# Patient Record
Sex: Female | Born: 1950 | Race: White | Hispanic: No | Marital: Married | Smoking: Never smoker
Health system: Southern US, Community
[De-identification: ages and names within clinical notes are randomized; demographics above are authoritative.]

## PROBLEM LIST (undated history)

## (undated) DIAGNOSIS — M1990 Unspecified osteoarthritis, unspecified site: Secondary | ICD-10-CM

## (undated) DIAGNOSIS — C801 Malignant (primary) neoplasm, unspecified: Secondary | ICD-10-CM

## (undated) DIAGNOSIS — K219 Gastro-esophageal reflux disease without esophagitis: Secondary | ICD-10-CM

## (undated) DIAGNOSIS — Z923 Personal history of irradiation: Secondary | ICD-10-CM

## (undated) DIAGNOSIS — Z9221 Personal history of antineoplastic chemotherapy: Secondary | ICD-10-CM

## (undated) DIAGNOSIS — K5900 Constipation, unspecified: Secondary | ICD-10-CM

## (undated) DIAGNOSIS — I639 Cerebral infarction, unspecified: Secondary | ICD-10-CM

## (undated) DIAGNOSIS — M810 Age-related osteoporosis without current pathological fracture: Secondary | ICD-10-CM

## (undated) DIAGNOSIS — F32A Depression, unspecified: Secondary | ICD-10-CM

## (undated) DIAGNOSIS — F329 Major depressive disorder, single episode, unspecified: Secondary | ICD-10-CM

## (undated) DIAGNOSIS — I10 Essential (primary) hypertension: Secondary | ICD-10-CM

## (undated) DIAGNOSIS — Z87442 Personal history of urinary calculi: Secondary | ICD-10-CM

## (undated) DIAGNOSIS — Z9889 Other specified postprocedural states: Secondary | ICD-10-CM

## (undated) DIAGNOSIS — E7800 Pure hypercholesterolemia, unspecified: Secondary | ICD-10-CM

## (undated) HISTORY — PX: CHOLECYSTECTOMY: SHX55

## (undated) HISTORY — DX: Age-related osteoporosis without current pathological fracture: M81.0

## (undated) HISTORY — PX: TRACHEOSTOMY: SUR1362

## (undated) HISTORY — PX: LYMPH GLAND EXCISION: SHX13

---

## 2002-07-16 DIAGNOSIS — C801 Malignant (primary) neoplasm, unspecified: Secondary | ICD-10-CM

## 2002-07-16 DIAGNOSIS — Z9221 Personal history of antineoplastic chemotherapy: Secondary | ICD-10-CM

## 2002-07-16 DIAGNOSIS — Z923 Personal history of irradiation: Secondary | ICD-10-CM

## 2002-07-16 HISTORY — PX: OTHER SURGICAL HISTORY: SHX169

## 2002-07-16 HISTORY — DX: Malignant (primary) neoplasm, unspecified: C80.1

## 2002-07-16 HISTORY — DX: Personal history of irradiation: Z92.3

## 2002-07-16 HISTORY — DX: Personal history of antineoplastic chemotherapy: Z92.21

## 2002-11-05 ENCOUNTER — Encounter: Payer: Self-pay | Admitting: Obstetrics and Gynecology

## 2002-11-05 ENCOUNTER — Ambulatory Visit (HOSPITAL_COMMUNITY): Admission: RE | Admit: 2002-11-05 | Discharge: 2002-11-05 | Payer: Self-pay | Admitting: Obstetrics and Gynecology

## 2002-11-18 ENCOUNTER — Encounter: Payer: Self-pay | Admitting: Obstetrics and Gynecology

## 2002-11-18 ENCOUNTER — Ambulatory Visit (HOSPITAL_COMMUNITY): Admission: RE | Admit: 2002-11-18 | Discharge: 2002-11-18 | Payer: Self-pay | Admitting: Obstetrics and Gynecology

## 2002-11-27 ENCOUNTER — Encounter: Payer: Self-pay | Admitting: Obstetrics and Gynecology

## 2002-11-27 ENCOUNTER — Encounter (INDEPENDENT_AMBULATORY_CARE_PROVIDER_SITE_OTHER): Payer: Self-pay | Admitting: Specialist

## 2002-11-27 ENCOUNTER — Encounter: Admission: RE | Admit: 2002-11-27 | Discharge: 2002-11-27 | Payer: Self-pay | Admitting: Obstetrics and Gynecology

## 2002-11-27 ENCOUNTER — Other Ambulatory Visit: Admission: RE | Admit: 2002-11-27 | Discharge: 2002-11-27 | Payer: Self-pay | Admitting: Diagnostic Radiology

## 2002-12-02 ENCOUNTER — Encounter: Payer: Self-pay | Admitting: General Surgery

## 2002-12-02 ENCOUNTER — Encounter (HOSPITAL_COMMUNITY): Admission: RE | Admit: 2002-12-02 | Discharge: 2003-03-02 | Payer: Self-pay | Admitting: General Surgery

## 2002-12-03 ENCOUNTER — Encounter: Payer: Self-pay | Admitting: General Surgery

## 2002-12-04 ENCOUNTER — Encounter: Payer: Self-pay | Admitting: General Surgery

## 2002-12-04 ENCOUNTER — Encounter: Admission: RE | Admit: 2002-12-04 | Discharge: 2002-12-04 | Payer: Self-pay | Admitting: General Surgery

## 2002-12-07 ENCOUNTER — Ambulatory Visit (HOSPITAL_BASED_OUTPATIENT_CLINIC_OR_DEPARTMENT_OTHER): Admission: RE | Admit: 2002-12-07 | Discharge: 2002-12-07 | Payer: Self-pay | Admitting: General Surgery

## 2002-12-07 ENCOUNTER — Encounter: Payer: Self-pay | Admitting: General Surgery

## 2002-12-07 ENCOUNTER — Encounter (INDEPENDENT_AMBULATORY_CARE_PROVIDER_SITE_OTHER): Payer: Self-pay | Admitting: *Deleted

## 2002-12-22 ENCOUNTER — Encounter: Payer: Self-pay | Admitting: Oncology

## 2002-12-22 ENCOUNTER — Ambulatory Visit: Admission: RE | Admit: 2002-12-22 | Discharge: 2003-01-06 | Payer: Self-pay | Admitting: Radiation Oncology

## 2002-12-22 ENCOUNTER — Ambulatory Visit (HOSPITAL_COMMUNITY): Admission: RE | Admit: 2002-12-22 | Discharge: 2002-12-22 | Payer: Self-pay | Admitting: Oncology

## 2002-12-29 ENCOUNTER — Encounter (INDEPENDENT_AMBULATORY_CARE_PROVIDER_SITE_OTHER): Payer: Self-pay | Admitting: *Deleted

## 2002-12-29 ENCOUNTER — Encounter: Payer: Self-pay | Admitting: General Surgery

## 2002-12-29 ENCOUNTER — Ambulatory Visit (HOSPITAL_BASED_OUTPATIENT_CLINIC_OR_DEPARTMENT_OTHER): Admission: RE | Admit: 2002-12-29 | Discharge: 2002-12-30 | Payer: Self-pay | Admitting: General Surgery

## 2003-01-26 ENCOUNTER — Encounter (HOSPITAL_COMMUNITY): Admission: RE | Admit: 2003-01-26 | Discharge: 2003-02-25 | Payer: Self-pay | Admitting: Oncology

## 2003-01-26 ENCOUNTER — Encounter: Admission: RE | Admit: 2003-01-26 | Discharge: 2003-01-26 | Payer: Self-pay | Admitting: Oncology

## 2003-03-02 ENCOUNTER — Encounter: Admission: RE | Admit: 2003-03-02 | Discharge: 2003-03-02 | Payer: Self-pay | Admitting: Oncology

## 2003-03-02 ENCOUNTER — Encounter (HOSPITAL_COMMUNITY): Admission: RE | Admit: 2003-03-02 | Discharge: 2003-04-01 | Payer: Self-pay | Admitting: Oncology

## 2003-04-14 ENCOUNTER — Encounter (HOSPITAL_COMMUNITY): Admission: RE | Admit: 2003-04-14 | Discharge: 2003-05-14 | Payer: Self-pay | Admitting: Oncology

## 2003-04-14 ENCOUNTER — Encounter: Admission: RE | Admit: 2003-04-14 | Discharge: 2003-04-14 | Payer: Self-pay | Admitting: Oncology

## 2003-05-26 ENCOUNTER — Encounter (HOSPITAL_COMMUNITY): Admission: RE | Admit: 2003-05-26 | Discharge: 2003-06-25 | Payer: Self-pay | Admitting: Oncology

## 2003-05-26 ENCOUNTER — Encounter: Admission: RE | Admit: 2003-05-26 | Discharge: 2003-05-26 | Payer: Self-pay | Admitting: Oncology

## 2003-06-03 ENCOUNTER — Ambulatory Visit: Admission: RE | Admit: 2003-06-03 | Discharge: 2003-08-05 | Payer: Self-pay | Admitting: Radiation Oncology

## 2003-06-28 ENCOUNTER — Encounter: Admission: RE | Admit: 2003-06-28 | Discharge: 2003-06-28 | Payer: Self-pay | Admitting: Oncology

## 2003-06-28 ENCOUNTER — Encounter (HOSPITAL_COMMUNITY): Admission: RE | Admit: 2003-06-28 | Discharge: 2003-07-28 | Payer: Self-pay | Admitting: Oncology

## 2003-07-13 ENCOUNTER — Ambulatory Visit (HOSPITAL_BASED_OUTPATIENT_CLINIC_OR_DEPARTMENT_OTHER): Admission: RE | Admit: 2003-07-13 | Discharge: 2003-07-13 | Payer: Self-pay | Admitting: General Surgery

## 2003-09-27 ENCOUNTER — Encounter: Admission: RE | Admit: 2003-09-27 | Discharge: 2003-09-27 | Payer: Self-pay | Admitting: Oncology

## 2003-09-27 ENCOUNTER — Encounter (HOSPITAL_COMMUNITY): Admission: RE | Admit: 2003-09-27 | Discharge: 2003-10-27 | Payer: Self-pay | Admitting: Oncology

## 2003-11-15 ENCOUNTER — Encounter (HOSPITAL_COMMUNITY): Admission: RE | Admit: 2003-11-15 | Discharge: 2003-12-15 | Payer: Self-pay | Admitting: Oncology

## 2003-11-15 ENCOUNTER — Encounter: Admission: RE | Admit: 2003-11-15 | Discharge: 2003-11-15 | Payer: Self-pay | Admitting: Oncology

## 2003-12-22 ENCOUNTER — Ambulatory Visit (HOSPITAL_COMMUNITY): Admission: RE | Admit: 2003-12-22 | Discharge: 2003-12-22 | Payer: Self-pay | Admitting: General Surgery

## 2004-04-12 ENCOUNTER — Encounter (HOSPITAL_COMMUNITY): Admission: RE | Admit: 2004-04-12 | Discharge: 2004-05-12 | Payer: Self-pay | Admitting: Internal Medicine

## 2004-04-12 ENCOUNTER — Encounter: Admission: RE | Admit: 2004-04-12 | Discharge: 2004-04-12 | Payer: Self-pay | Admitting: Oncology

## 2004-09-28 ENCOUNTER — Ambulatory Visit (HOSPITAL_COMMUNITY): Admission: RE | Admit: 2004-09-28 | Discharge: 2004-09-28 | Payer: Self-pay | Admitting: *Deleted

## 2004-11-15 ENCOUNTER — Encounter (HOSPITAL_COMMUNITY): Admission: RE | Admit: 2004-11-15 | Discharge: 2004-12-15 | Payer: Self-pay | Admitting: Oncology

## 2004-11-15 ENCOUNTER — Encounter: Admission: RE | Admit: 2004-11-15 | Discharge: 2004-11-15 | Payer: Self-pay | Admitting: Oncology

## 2004-11-15 ENCOUNTER — Ambulatory Visit (HOSPITAL_COMMUNITY): Payer: Self-pay | Admitting: Oncology

## 2005-02-28 ENCOUNTER — Encounter: Admission: RE | Admit: 2005-02-28 | Discharge: 2005-02-28 | Payer: Self-pay | Admitting: Oncology

## 2005-02-28 ENCOUNTER — Encounter (HOSPITAL_COMMUNITY): Admission: RE | Admit: 2005-02-28 | Discharge: 2005-03-30 | Payer: Self-pay | Admitting: Oncology

## 2005-05-30 ENCOUNTER — Encounter (HOSPITAL_COMMUNITY): Admission: RE | Admit: 2005-05-30 | Discharge: 2005-06-29 | Payer: Self-pay | Admitting: Oncology

## 2005-05-30 ENCOUNTER — Encounter: Admission: RE | Admit: 2005-05-30 | Discharge: 2005-05-30 | Payer: Self-pay | Admitting: Oncology

## 2005-05-30 ENCOUNTER — Ambulatory Visit (HOSPITAL_COMMUNITY): Payer: Self-pay | Admitting: Oncology

## 2005-11-05 ENCOUNTER — Ambulatory Visit (HOSPITAL_COMMUNITY): Admission: RE | Admit: 2005-11-05 | Discharge: 2005-11-05 | Payer: Self-pay | Admitting: Family Medicine

## 2005-11-28 ENCOUNTER — Encounter: Admission: RE | Admit: 2005-11-28 | Discharge: 2005-11-28 | Payer: Self-pay | Admitting: Oncology

## 2005-11-28 ENCOUNTER — Ambulatory Visit (HOSPITAL_COMMUNITY): Payer: Self-pay | Admitting: Oncology

## 2005-11-28 ENCOUNTER — Encounter (HOSPITAL_COMMUNITY): Admission: RE | Admit: 2005-11-28 | Discharge: 2005-12-28 | Payer: Self-pay | Admitting: Oncology

## 2006-01-07 ENCOUNTER — Ambulatory Visit (HOSPITAL_COMMUNITY): Admission: RE | Admit: 2006-01-07 | Discharge: 2006-01-07 | Payer: Self-pay | Admitting: Neurology

## 2006-01-14 ENCOUNTER — Ambulatory Visit (HOSPITAL_COMMUNITY): Admission: RE | Admit: 2006-01-14 | Discharge: 2006-01-14 | Payer: Self-pay | Admitting: Interventional Radiology

## 2006-04-10 ENCOUNTER — Encounter (HOSPITAL_COMMUNITY): Admission: RE | Admit: 2006-04-10 | Discharge: 2006-04-12 | Payer: Self-pay | Admitting: Oncology

## 2006-04-10 ENCOUNTER — Encounter: Admission: RE | Admit: 2006-04-10 | Discharge: 2006-04-12 | Payer: Self-pay | Admitting: Oncology

## 2006-06-19 ENCOUNTER — Ambulatory Visit (HOSPITAL_COMMUNITY): Payer: Self-pay | Admitting: Oncology

## 2006-06-19 ENCOUNTER — Encounter (HOSPITAL_COMMUNITY): Admission: RE | Admit: 2006-06-19 | Discharge: 2006-07-15 | Payer: Self-pay | Admitting: Oncology

## 2006-06-19 ENCOUNTER — Encounter: Admission: RE | Admit: 2006-06-19 | Discharge: 2006-06-19 | Payer: Self-pay | Admitting: Oncology

## 2006-08-08 ENCOUNTER — Emergency Department (HOSPITAL_COMMUNITY): Admission: EM | Admit: 2006-08-08 | Discharge: 2006-08-08 | Payer: Self-pay | Admitting: Emergency Medicine

## 2007-01-08 ENCOUNTER — Encounter (HOSPITAL_COMMUNITY): Admission: RE | Admit: 2007-01-08 | Discharge: 2007-02-07 | Payer: Self-pay | Admitting: Oncology

## 2007-01-08 ENCOUNTER — Ambulatory Visit (HOSPITAL_COMMUNITY): Payer: Self-pay | Admitting: Oncology

## 2007-06-04 ENCOUNTER — Encounter (HOSPITAL_COMMUNITY): Admission: RE | Admit: 2007-06-04 | Discharge: 2007-07-04 | Payer: Self-pay | Admitting: Oncology

## 2007-10-08 ENCOUNTER — Encounter (HOSPITAL_COMMUNITY): Admission: RE | Admit: 2007-10-08 | Discharge: 2007-11-07 | Payer: Self-pay | Admitting: Oncology

## 2007-10-08 ENCOUNTER — Ambulatory Visit (HOSPITAL_COMMUNITY): Payer: Self-pay | Admitting: Oncology

## 2008-09-02 ENCOUNTER — Encounter (HOSPITAL_COMMUNITY): Admission: RE | Admit: 2008-09-02 | Discharge: 2008-10-02 | Payer: Self-pay | Admitting: Oncology

## 2008-11-03 ENCOUNTER — Encounter (HOSPITAL_COMMUNITY): Admission: RE | Admit: 2008-11-03 | Discharge: 2008-12-03 | Payer: Self-pay | Admitting: Oncology

## 2008-11-03 ENCOUNTER — Ambulatory Visit (HOSPITAL_COMMUNITY): Payer: Self-pay | Admitting: Oncology

## 2009-10-19 ENCOUNTER — Ambulatory Visit (HOSPITAL_COMMUNITY): Admission: RE | Admit: 2009-10-19 | Discharge: 2009-10-19 | Payer: Self-pay | Admitting: Oncology

## 2009-12-07 ENCOUNTER — Ambulatory Visit (HOSPITAL_COMMUNITY): Payer: Self-pay | Admitting: Oncology

## 2010-10-03 ENCOUNTER — Other Ambulatory Visit (HOSPITAL_COMMUNITY): Payer: Self-pay | Admitting: Oncology

## 2010-10-03 DIAGNOSIS — Z139 Encounter for screening, unspecified: Secondary | ICD-10-CM

## 2010-10-23 ENCOUNTER — Ambulatory Visit (HOSPITAL_COMMUNITY): Payer: Managed Care, Other (non HMO)

## 2010-10-24 ENCOUNTER — Ambulatory Visit (HOSPITAL_COMMUNITY)
Admission: RE | Admit: 2010-10-24 | Discharge: 2010-10-24 | Disposition: A | Discharge status: Home / Self Care | Payer: Managed Care, Other (non HMO) | Source: Ambulatory Visit | Attending: Oncology | Admitting: Oncology

## 2010-10-24 DIAGNOSIS — Z139 Encounter for screening, unspecified: Secondary | ICD-10-CM

## 2010-10-24 DIAGNOSIS — Z1231 Encounter for screening mammogram for malignant neoplasm of breast: Secondary | ICD-10-CM | POA: Insufficient documentation

## 2010-10-25 LAB — DIFFERENTIAL
Basophils Absolute: 0 10*3/uL (ref 0.0–0.1)
Basophils Relative: 1 % (ref 0–1)
Eosinophils Relative: 2 % (ref 0–5)
Lymphocytes Relative: 36 % (ref 12–46)
Lymphs Abs: 1.5 10*3/uL (ref 0.7–4.0)
Monocytes Absolute: 0.3 10*3/uL (ref 0.1–1.0)
Neutro Abs: 2.3 10*3/uL (ref 1.7–7.7)

## 2010-10-25 LAB — CBC: RDW: 13.9 % (ref 11.5–15.5)

## 2010-10-25 LAB — COMPREHENSIVE METABOLIC PANEL
ALT: 18 U/L (ref 0–35)
Albumin: 4 g/dL (ref 3.5–5.2)
CO2: 31 mEq/L (ref 19–32)
Calcium: 9.1 mg/dL (ref 8.4–10.5)
Chloride: 104 mEq/L (ref 96–112)
Creatinine, Ser: 0.78 mg/dL (ref 0.4–1.2)
Glucose, Bld: 92 mg/dL (ref 70–99)
Potassium: 3.6 mEq/L (ref 3.5–5.1)
Sodium: 139 mEq/L (ref 135–145)

## 2010-11-21 ENCOUNTER — Other Ambulatory Visit (HOSPITAL_COMMUNITY): Payer: Self-pay | Admitting: Orthopedic Surgery

## 2010-11-21 DIAGNOSIS — M25561 Pain in right knee: Secondary | ICD-10-CM

## 2010-11-23 ENCOUNTER — Ambulatory Visit (HOSPITAL_COMMUNITY)
Admission: RE | Admit: 2010-11-23 | Discharge: 2010-11-23 | Disposition: A | Discharge status: Home / Self Care | Payer: Managed Care, Other (non HMO) | Source: Ambulatory Visit | Attending: Orthopedic Surgery | Admitting: Orthopedic Surgery

## 2010-11-23 DIAGNOSIS — M7120 Synovial cyst of popliteal space [Baker], unspecified knee: Secondary | ICD-10-CM | POA: Insufficient documentation

## 2010-11-23 DIAGNOSIS — IMO0002 Reserved for concepts with insufficient information to code with codable children: Secondary | ICD-10-CM | POA: Insufficient documentation

## 2010-11-23 DIAGNOSIS — M25569 Pain in unspecified knee: Secondary | ICD-10-CM | POA: Insufficient documentation

## 2010-11-23 DIAGNOSIS — M25561 Pain in right knee: Secondary | ICD-10-CM

## 2010-12-01 NOTE — Op Note (Signed)
NAME:  Rachel Wood, Rachel Wood                          ACCOUNT NO.:  [id number]   MEDICAL RECORD NO.:  [id number]                   PATIENT TYPE:  AMB   LOCATION:  DSC                                  FACILITY:  [facility]   PHYSICIAN:  Rose Phi. Maple Hudson, M.D.                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   OPERATION PERFORMED:  1. Blue dye injection.  2. Left sentinel lymph node biopsy.  3. Left partial mastectomy.   SURGEON:  Rose Phi. Maple Hudson, M.D.   ANESTHESIA:  General.   DESCRIPTION OF PROCEDURE:  Prior to coming to the operating room 1 mCi of  technetium sulfur colloid was injected intradermally.  After suitable  general anesthesia, the patient was placed in supine position with the left  arm extended on the arm board.  A mixture of 2mL of methylene blue and 3mL  of saline was then injected in subareolar tissue and the breast massaged for  about three minutes.   We then prepped and draped in standard fashion.  I outlined the palpable  mass at about the 11 o'clock position in the left breast just away from the  areolar margin.  We carefully scanned the internal mammary, supraclavicular  and axillary areas with the Neoprobe and there was only a hot spot in the  axilla.  A short transverse incision was made in the left axilla with  dissection through the subcutaneous tissue to the clavipectoral fascia.  The  clavipectoral fascia was then divided and just deep to it was a large blue  and quite hot node with counts in excess of 4000.  I excised it by clipping  the lymphatics and using the cautery.  After removing it, I could find no  other blue, hot or palpable nodes.  It was submitted to the pathologist for  Touch Preps for metastatic disease.   While that was  being done, I outlined the tumor  in the upper inner quadrant  of her left breast.  We then made an incision. I did a wide excision around  it.  The specimen was then oriented for the pathologist and submitted for  Touch Preps for margin clearance.  The Touch Preps on the sentinel node were  negative and the margins were also negative.  The incisions were then closed  with 3-0 Vicryl and subcuticular 4-0 Monocryl and Steri-Strips.  Dressing  applied.  The patient was then transferred to the recovery room in  satisfactory condition having tolerated the procedure well.  Rose Phi. Maple Hudson, M.D.    PRY/MEDQ  D:  12/07/2002  T:  12/07/2002  Job:  [id number]

## 2010-12-01 NOTE — Op Note (Signed)
NAME:  Rachel Wood, Rachel Wood                          ACCOUNT NO.:  [id number]   MEDICAL RECORD NO.:  [id number]                   PATIENT TYPE:  OUT   LOCATION:  XRAY                                 FACILITY:  [facility]   PHYSICIAN:  Rose Phi. Maple Hudson, M.D.                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   OPERATION PERFORMED:  1. Completion left axillary lymph node dissection.  2. Port-A-Cath placement.   SURGEON:  Rose Phi. Maple Hudson, M.D.   ANESTHESIA:  General.   INDICATIONS FOR PROCEDURE:  This 60 year old married female had presented  with a palpable mass in the left breast and she underwent a left partial  mastectomy and sentinel node biopsy.  The sentinel node was originally  interpreted as negative but turned out to have a 2.5 mm metastasis in it.  She is scheduled now for completion axillary lymph node dissection to see if  there is further lymph node involvement and Port-A-Cath placement for  chemotherapy.   DESCRIPTION OF PROCEDURE:  After suitable general endotracheal anesthesia  was induced, the patient was placed in supine position with the arms  extended on the arm board and the left breast and axilla prepped and draped  in the usual fashion.  The transverse left axillary incision was made with  dissection through the subcutaneous tissue to the clavipectoral fascia.  With appropriate exposure, we dissected along the pectoralis major muscle  retracting it and exposing the clavipectoral fascia over the axillary vein  and then dissecting along the pectoralis minor.  We then dissected out all  the tissue deep to the pectoralis minor and inferior to the vein.  The long  thoracic and thoracodorsal nerves were identified and preserved and other  vessels and nerves were clipped and divided.  Following removal of the  specimen, we had good hemostasis.  We thoroughly irrigated the field with  saline.  A 19 Blake drain was inserted and brought out through a separate  stab wound.  The subcutaneous tissue was closed with 3-0 Vicryl and the skin  with staples.  Dressing was then applied.   We then repositioned the patient and then reprepped and draped on the right  anterior chest wall and the neck for Port-A-Cath placement.  A right  subclavian puncture was carried out without difficulty and the guidewire  inserted and proper positioning confirmed by fluoroscopy.  An anterior  incision was made on the chest wall and a pocket developed for the  implantable port.  It tunneled between the subclavian puncture site and this  newly developed pocket and passed the preconnected catheter and then  anchored the export in the pocket with  two 2-0 Prolene sutures.  The  catheter tip was then trimmed to fit the superior vena cava.  A dilator and  peel-away sheath were then passed over the wire and then the wire was  removed followed by the dilator and the catheter was passed through the peel-  away sheath and then it was removed.  Fluoroscopy confirmed that the system  was in the superior vena cava and there was no kinking.  We easily aspirated  and then fully heparinized the port.   The incisions were closed with 3-0 Vicryl and subcuticular 4-0 Monocryl and  Steri-Strips.  Dressings applied.  The patient was then transferred to the  recovery room in satisfactory condition having tolerated the procedure well.                                               Rose Phi. Maple Hudson, M.D.    PRY/MEDQ  D:  12/29/2002  T:  12/29/2002  Job:  [id number]   cc:   Pierce Crane, M.D.  [address] [facility]  [place]  [place] [address]  Fax: [phone]   Tilda Burrow, M.D.  [address] [place]  [place] [address]  Fax: [phone]

## 2010-12-01 NOTE — Procedures (Signed)
[name], [name]                ACCOUNT NO.:  [id number]   MEDICAL RECORD NO.:  [id number]          PATIENT TYPE:  OUT   LOCATION:  RAD                           FACILITY:  [facility]   PHYSICIAN:  Darlin Priestly, MD  DATE OF BIRTH:  1951/01/08   DATE OF PROCEDURE:  01/09/2006  DATE OF DISCHARGE:  01/07/2006                                  ECHOCARDIOGRAM   Rachel Wood is a 60 year old female patient of Dr. Gerilyn Pilgrim and Dr. Domingo Sep  with a history of TIA, questionable CVA and hypertension. She presents now  for a 2D echocardiogram to evaluate LV function and valvular structures.   The aorta is within normal limits at 2.7 cm.   The left atrium appears mildly enlarged. The patient is in sinus rhythm  during the procedure.   __________ Are mildly thickened at 1.4 and 1.2 cm respectively.   The aortic valve is mildly thickened with no significant aortic stenosis or  regurgitation.   The is mild thickening of the mitral valve leaflets with mild mitral  regurgitation.   Obstruction of the tricuspid valve with mild tricuspid regurgitation.   Left ventricular internal dimensions are within normal limits at 4.3 and 3.3  cm respectively. There is good overall left ventricular function estimated  at 60% with no segmental wall motion abnormalities.   Normal RV size and systolic function.   CONCLUSION:  1.  Borderline concentric left ventricular hypertrophy with normal left      ventricular systolic function, estimated ejection fraction of 60%.  2.  Mildly thickened aortic valve with no evidence of significant aortic      stenosis or regurgitation.  3.  Mildly thickened mitral valve leaflets with mild mitral regurgitation.  4.  Obstruction of tricuspid valve with mild tricuspid regurgitation.  5.  Mild left atrial enlargement.  6.  Normal right ventricular size and systolic function.  7.  There is no intracardiac source of embolus noted however could not      exclude by TEE, consider  TEE if clinically indicated.      Darlin Priestly, MD  Electronically Signed     RHM/MEDQ  D:  01/09/2006  T:  01/09/2006  Job:  [id number]   cc:   Darleen Crocker A. Gerilyn Pilgrim, M.D.  Fax: [phone]   Dani Gobble, MD  Fax: [phone]

## 2010-12-01 NOTE — Procedures (Signed)
Rachel Wood, COLLETT NO.:  [id number]   MEDICAL RECORD NO.:  [id number]          PATIENT TYPE:  OUT   LOCATION:  RAD                           FACILITY:  [facility]   PHYSICIAN:  Dani Gobble, MD       DATE OF BIRTH:  [birth year]   DATE OF PROCEDURE:  DATE OF DISCHARGE:                                  ECHOCARDIOGRAM   REFERRING PHYSICIAN:  Patrica Duel, M.D., Dani Gobble, M.D.   INDICATIONS:  To assess LV function, status post chemotherapy for breast  cancer.   Technical quality of the study is adequate.   Aorta is within normal limits at 2.6 cm.   The left atrium is within normal limits at 3.6 cm. No obvious clots or  masses were appreciated. The patient appeared to be in sinus rhythm during  this procedure.   The intraventricular septum is notable for basal septal hypertrophy but  otherwise appears to be within normal limits and thickness. The posterior  wall is also normal at 1.1 cm.   Aortic valve appears structurally normal. No significant aortic  insufficiency is noted.  Doppler interrogation of the aortic valve is within  normal limits.   The mitral valve also appears structurally normal. No mitral valve prolapse  is noted. Trivial mitral regurgitation is noted.  Doppler interrogation of  the mitral valve is within normal limits.   The pulmonic valve is incompletely visualized but appeared to be grossly  structurally normal.   Tricuspid valve also appears grossly structurally normal with trace-to-mild  tricuspid regurgitation noted.   Left ventricle is normal in size with the LVIDD measuring at 4.3 cm and the  LVISD measuring 2.9 cm. Overall, left systolic function is normal, and no  regional wall motion abnormalities are noted. The presence of mild diastolic  dysfunction is noted.   The right atrium and right ventricle are normal in size.  Right ventricular  systolic function is normal.   No pericardial effusion is appreciated.   IMPRESSION:  1.  Mild basal septal hypertrophy.  2.  Trivial mitral and trace-to-mild tricuspid regurgitation.  3.  Normal left ventricular size and systolic function without regional wall      motion abnormality.  4.  Mild diastolic dysfunction.      AB/MEDQ  D:  09/28/2004  T:  09/28/2004  Job:  [id number]   cc:   Patrica Duel, M.D.  [address]  [place]  [place] [address]  Fax: [phone]

## 2010-12-01 NOTE — Procedures (Signed)
NAME:  LAKASHIA, COLLISON                          ACCOUNT NO.:  [id number]   MEDICAL RECORD NO.:  [id number]                  PATIENT TYPE:   LOCATION:                                       FACILITY:   PHYSICIAN:  Ladona Horns. Neijstrom, MD               DATE OF BIRTH:  12-16-1950   DATE OF PROCEDURE:  06/16/2003  DATE OF DISCHARGE:                                  ECHOCARDIOGRAM   INDICATIONS FOR PROCEDURE:  Ms. Westergren is a 60 year old female with a  history of breast cancer and hypertension, who has just completed  chemotherapy and is beginning radiation treatment.   TECHNICAL QUALITY:  Adequate.   FINDINGS:  The aorta is within normal limits at 2.7 cm.   The left atrium is within normal limits at 3.6 cm. No obvious clots or  masses were appreciated and the patient appeared to be in sinus rhythm  during this procedure.   The interventricular septum was mildly thickened at 1.2 cm while the  posterior wall was within normal limits at 1.1 cm.   The aortic valve appeared mildly thickened but with normal leaflet  excursion. No aortic insufficiency was noted. Doppler interrogation of the  aortic valve was within normal limits.   The mitral valve also appeared grossly structurally normal. No mitral valve  prolapse was noted. Trivial mitral regurgitation was present.   The pulmonic valve was not well visualized but appeared to be grossly  structurally normal.   The tricuspid valve also appeared to be grossly structurally normal with  mild tricuspid regurgitation noted.   The left ventricle was normal in size with the LVIDD measured at 4.3 cm and  the LVISD measured at 3.2 cm. Overall left ventricular systolic function was  normal and no regional wall motion abnormalities were appreciated. The right  atrium and right ventricle were normal in size and right ventricular  systolic function was normal. The inferior vena cava was normal in size with  good collapse. No pericardial effusion  was appreciated.   IMPRESSION:  1. Normal chamber sizes.  2. Normal right and left ventricular systolic function.  3. Mild asymmetric septal hypertrophy.  4. Mild aortic sclerosis without stenosis.  5. Trivial mitral and mild tricuspid regurgitation.     ________________________________________  ___________________________________________  Dani Gobble, MD                           Ladona Horns. Mariel Sleet, MD   AB/MEDQ  D:  06/16/2003  T:  06/16/2003  Job:  [id number]   cc:   Ladona Horns. Neijstrom, MD  [address]  [place]  [place] [address]  Fax: [phone]

## 2010-12-01 NOTE — Op Note (Signed)
NAME:  KAYDEN, AMEND                          ACCOUNT NO.:  [id number]   MEDICAL RECORD NO.:  [id number]                   PATIENT TYPE:  AMB   LOCATION:  DSC                                  FACILITY:  [facility]   PHYSICIAN:  Rose Phi. Maple Hudson, M.D.                DATE OF BIRTH:  1951/03/15   DATE OF PROCEDURE:  07/13/2003  DATE OF DISCHARGE:                                 OPERATIVE REPORT   PREOPERATIVE DIAGNOSIS:  Cancer of the breast.   POSTOPERATIVE DIAGNOSIS:  Cancer of the breast.   OPERATION:  Removal of Port-A-Cath.   SURGEON:  Rose Phi. Maple Hudson, M.D.   ANESTHESIA:  Local.   DESCRIPTION OF PROCEDURE:  The patient placed on the operating table and the  right upper chest prepped and draped in the usual fashion after infiltrating  the area with 1% Xylocaine with adrenaline. An incision was made and the  Port-A-Cath exposed. The catheter was grasped and removed from the vein and  then two holding sutures were divided and the port slipped out. No bleeding.  Subcuticular closure of 4-0 Monocryl with Steri-Strips.  Dressing applied.  The patient then allowed to go home.                                               Rose Phi. Maple Hudson, M.D.    PRY/MEDQ  D:  07/13/2003  T:  07/13/2003  Job:  [id number]

## 2010-12-06 ENCOUNTER — Encounter (HOSPITAL_COMMUNITY): Payer: Managed Care, Other (non HMO) | Attending: Oncology | Admitting: Oncology

## 2010-12-06 DIAGNOSIS — C439 Malignant melanoma of skin, unspecified: Secondary | ICD-10-CM

## 2011-04-09 LAB — COMPREHENSIVE METABOLIC PANEL
AST: 16
Albumin: 3.9
Alkaline Phosphatase: 52
BUN: 13
GFR calc non Af Amer: 56 — ABNORMAL LOW
Potassium: 3.6
Total Protein: 6.6

## 2011-04-09 LAB — DIFFERENTIAL
Eosinophils Absolute: 0.1
Lymphs Abs: 2.2
Monocytes Absolute: 0.5
Neutro Abs: 2.7
Neutrophils Relative %: 49

## 2011-04-09 LAB — CBC: Platelets: 164

## 2011-05-02 LAB — COMPREHENSIVE METABOLIC PANEL
AST: 18
Albumin: 4
BUN: 15
CO2: 33 — ABNORMAL HIGH
GFR calc Af Amer: >60
GFR calc non Af Amer: >60
Glucose, Bld: 105 — ABNORMAL HIGH
Potassium: 4.1
Total Protein: 6.3

## 2011-05-02 LAB — CBC
Platelets: 179
WBC: 4.2

## 2011-05-02 LAB — DIFFERENTIAL
Basophils Absolute: 0
Basophils Relative: 1
Eosinophils Absolute: 0.1
Lymphs Abs: 1.5
Monocytes Absolute: 0.3
Neutro Abs: 2.3
Neutrophils Relative %: 55

## 2011-11-21 ENCOUNTER — Telehealth: Payer: Self-pay

## 2011-11-21 ENCOUNTER — Other Ambulatory Visit (HOSPITAL_COMMUNITY): Payer: Self-pay | Admitting: Oncology

## 2011-11-21 ENCOUNTER — Other Ambulatory Visit: Payer: Self-pay

## 2011-11-21 DIAGNOSIS — Z139 Encounter for screening, unspecified: Secondary | ICD-10-CM

## 2011-11-26 ENCOUNTER — Ambulatory Visit (HOSPITAL_COMMUNITY)
Admission: RE | Admit: 2011-11-26 | Discharge: 2011-11-26 | Disposition: A | Discharge status: Home / Self Care | Payer: Managed Care, Other (non HMO) | Source: Ambulatory Visit | Attending: Oncology | Admitting: Oncology

## 2011-11-26 DIAGNOSIS — Z1231 Encounter for screening mammogram for malignant neoplasm of breast: Secondary | ICD-10-CM | POA: Insufficient documentation

## 2011-11-26 DIAGNOSIS — Z139 Encounter for screening, unspecified: Secondary | ICD-10-CM

## 2011-11-26 NOTE — Telephone Encounter (Signed)
MOVI PREP SPLIT DOSING, REGULAR BREAKFAST. CLEAR LIQUIDS AFTER 9 AM.  

## 2011-11-26 NOTE — Telephone Encounter (Signed)
Rx and instructions mailed to pt.  

## 2011-11-26 NOTE — Telephone Encounter (Signed)
Gastroenterology Pre-Procedure Form   Request Date: 11/21/2011       Requesting Physician: Dr. Mariel Sleet     PATIENT INFORMATION:  Rachel Wood is a 61 y.o., female (DOB=06-03-1951).  PROCEDURE: Procedure(s) requested: colonoscopy Procedure Reason: screening for colon cancer  PATIENT REVIEW QUESTIONS: The patient reports the following:   1. Diabetes Melitis: no 2. Joint replacements in the past 12 months: no 3. Major health problems in the past 3 months: no 4. Has an artificial valve or MVP:no 5. Has been advised in past to take antibiotics in advance of a procedure like teeth cleaning: no}    MEDICATIONS & ALLERGIES:    Patient reports the following regarding taking any blood thinners:   Plavix? no Aspirin?no Coumadin?  no  Patient confirms/reports the following medications:  Current Outpatient Prescriptions  Medication Sig Dispense Refill  . bisoprolol-hydrochlorothiazide (ZIAC) 2.5-6.25 MG per tablet Take 1 tablet by mouth daily.      . Naproxen Sodium (ALEVE) 220 MG CAPS Take by mouth. Two tablets daily      . PARoxetine (PAXIL) 10 MG tablet Take 10 mg by mouth 1 day or 1 dose.      . valsartan-hydrochlorothiazide (DIOVAN-HCT) 160-12.5 MG per tablet Take 1 tablet by mouth daily.        Patient confirms/reports the following allergies:  Allergies  Allergen Reactions  . Codeine Nausea And Vomiting    Patient is appropriate to schedule for requested procedure(s): yes  AUTHORIZATION INFORMATION Primary Insurance:   ID #:   Group #:  Pre-Cert / Auth required:  Pre-Cert / Auth #:   Secondary Insurance:   ID #: Group #: Pre-Cert / Auth required: Pre-Cert / Auth #:   No orders of the defined types were placed in this encounter.    SCHEDULE INFORMATION: Procedure has been scheduled as follows:  Date: 12/24/2011    Time: 8:30 AM  Location: [facility] Short Stay  This Gastroenterology Pre-Precedure Form is being routed to the following provider(s) for  review: Jonette Eva, MD

## 2011-12-05 ENCOUNTER — Ambulatory Visit (HOSPITAL_COMMUNITY): Payer: Managed Care, Other (non HMO) | Admitting: Oncology

## 2011-12-11 ENCOUNTER — Telehealth: Payer: Self-pay

## 2011-12-11 NOTE — Telephone Encounter (Signed)
Called to update triage prior to colonoscopy on 12/24/2011 with SF. Many rings and no answer.

## 2011-12-17 NOTE — Telephone Encounter (Signed)
Called pt. Many rings and no answer. Will mail letter to call and update triage.

## 2011-12-18 ENCOUNTER — Encounter (HOSPITAL_COMMUNITY): Payer: Self-pay | Admitting: Pharmacy Technician

## 2011-12-18 ENCOUNTER — Telehealth: Payer: Self-pay

## 2011-12-18 NOTE — Telephone Encounter (Signed)
Pt said that someone had tried to call her from here yesterday, and also the pharmacist from [facility] had called. She said she will not get her calls at home until late if anyone needs to call her, please call her at work at [phone].

## 2011-12-19 ENCOUNTER — Telehealth: Payer: Self-pay

## 2011-12-19 NOTE — Telephone Encounter (Signed)
Called pt to update triage. She has not had any new problems and no change in medications.

## 2011-12-19 NOTE — Telephone Encounter (Signed)
Called work. Pt does not work on Wednesdays. Called home x 2. Busy both times.

## 2011-12-19 NOTE — Telephone Encounter (Signed)
REVIEWED.  

## 2011-12-24 ENCOUNTER — Encounter (HOSPITAL_COMMUNITY)
Admission: RE | Disposition: A | Discharge status: Home Health | Payer: Self-pay | Source: Ambulatory Visit | Attending: Gastroenterology

## 2011-12-24 ENCOUNTER — Ambulatory Visit (HOSPITAL_COMMUNITY)
Admission: RE | Admit: 2011-12-24 | Discharge: 2011-12-24 | Disposition: A | Discharge status: Home Health | Payer: Managed Care, Other (non HMO) | Source: Ambulatory Visit | Attending: Gastroenterology | Admitting: Gastroenterology

## 2011-12-24 ENCOUNTER — Encounter (HOSPITAL_COMMUNITY): Payer: Self-pay | Admitting: *Deleted

## 2011-12-24 DIAGNOSIS — K648 Other hemorrhoids: Secondary | ICD-10-CM | POA: Insufficient documentation

## 2011-12-24 DIAGNOSIS — Z1211 Encounter for screening for malignant neoplasm of colon: Secondary | ICD-10-CM | POA: Insufficient documentation

## 2011-12-24 DIAGNOSIS — I10 Essential (primary) hypertension: Secondary | ICD-10-CM | POA: Insufficient documentation

## 2011-12-24 DIAGNOSIS — Z79899 Other long term (current) drug therapy: Secondary | ICD-10-CM | POA: Insufficient documentation

## 2011-12-24 DIAGNOSIS — Z853 Personal history of malignant neoplasm of breast: Secondary | ICD-10-CM | POA: Insufficient documentation

## 2011-12-24 DIAGNOSIS — Z139 Encounter for screening, unspecified: Secondary | ICD-10-CM

## 2011-12-24 DIAGNOSIS — E7800 Pure hypercholesterolemia, unspecified: Secondary | ICD-10-CM | POA: Insufficient documentation

## 2011-12-24 HISTORY — DX: Malignant (primary) neoplasm, unspecified: C80.1

## 2011-12-24 HISTORY — DX: Constipation, unspecified: K59.00

## 2011-12-24 HISTORY — DX: Depression, unspecified: F32.A

## 2011-12-24 HISTORY — DX: Essential (primary) hypertension: I10

## 2011-12-24 HISTORY — DX: Pure hypercholesterolemia, unspecified: E78.00

## 2011-12-24 HISTORY — DX: Major depressive disorder, single episode, unspecified: F32.9

## 2011-12-24 HISTORY — PX: COLONOSCOPY: SHX5424

## 2011-12-24 SURGERY — COLONOSCOPY
Anesthesia: Moderate Sedation

## 2011-12-24 MED ORDER — MEPERIDINE HCL 100 MG/ML IJ SOLN
INTRAMUSCULAR | Status: AC
  Filled 2011-12-24: qty 1

## 2011-12-24 MED ORDER — MEPERIDINE HCL 100 MG/ML IJ SOLN
INTRAMUSCULAR | Status: DC | PRN
  Administered 2011-12-24 (×2): 25 mg via INTRAVENOUS

## 2011-12-24 MED ORDER — MIDAZOLAM HCL 5 MG/5ML IJ SOLN
INTRAMUSCULAR | Status: DC | PRN
  Administered 2011-12-24 (×2): 2 mg via INTRAVENOUS

## 2011-12-24 MED ORDER — SODIUM CHLORIDE 0.45 % IV SOLN
Freq: Once | INTRAVENOUS | Status: AC
  Administered 2011-12-24: 08:00:00 via INTRAVENOUS

## 2011-12-24 MED ORDER — MIDAZOLAM HCL 5 MG/5ML IJ SOLN
INTRAMUSCULAR | Status: AC
  Filled 2011-12-24: qty 10

## 2011-12-24 NOTE — H&P (Signed)
  Primary Care Physician:  Dwana Melena, MD, MD Primary Gastroenterologist:  Dr. Darrick Penna  Pre-Procedure History & Physical: HPI:  Rachel Wood is a 61 y.o. female here for COLON CANCER SCREENING.   Past Medical History  Diagnosis Date  . Depression   . Hypertension   . Cancer 2004    Breast Cancer  . Hypercholesteremia   . Constipation     Past Surgical History  Procedure Date  . Tracheostomy     as a child  . Left breast lumpectomy 2004  . Cholecystectomy     Prior to Admission medications   Medication Sig Start Date End Date Taking? Authorizing Provider  bisoprolol-hydrochlorothiazide (ZIAC) 2.5-6.25 MG per tablet Take 1 tablet by mouth every morning.    Yes Historical Provider, MD  Naproxen Sodium (ALEVE) 220 MG CAPS Take 440 mg by mouth every morning.    Yes Historical Provider, MD  PARoxetine (PAXIL) 10 MG tablet Take 10 mg by mouth every morning.    Yes Historical Provider, MD  valsartan-hydrochlorothiazide (DIOVAN-HCT) 160-12.5 MG per tablet Take 1 tablet by mouth every morning.    Yes Historical Provider, MD    Allergies as of 11/21/2011 - Review Complete 11/21/2011  Allergen Reaction Noted  . Codeine Nausea And Vomiting 11/21/2011    No family history on file.  History   Social History  . Marital Status: Married    Spouse Name: N/A    Number of Children: N/A  . Years of Education: N/A   Occupational History  . Not on file.   Social History Main Topics  . Smoking status: Never Smoker   . Smokeless tobacco: Not on file  . Alcohol Use: No  . Drug Use: No  . Sexually Active:    Other Topics Concern  . Not on file   Social History Narrative  . No narrative on file    Review of Systems: See HPI, otherwise negative ROS   Physical Exam: BP 136/68  Pulse 54  Temp(Src) 97.7 F (36.5 C) (Oral)  Resp 18  Ht 5\' 3"  (1.6 m)  Wt 169 lb (76.658 kg)  BMI 29.94 kg/m2  SpO2 97% General:   Alert,  pleasant and cooperative in NAD Head:  Normocephalic  and atraumatic. Neck:  Supple;  Lungs:  Clear throughout to auscultation.    Heart:  Regular rate and rhythm. Abdomen:  Soft, nontender and nondistended. Normal bowel sounds, without guarding, and without rebound.   Neurologic:  Alert and  oriented x4;  grossly normal neurologically.  Impression/Plan:     SCREENING  Plan:  1. TCS TODAY

## 2011-12-24 NOTE — Op Note (Signed)
[facility] [address] [place], [place]  [address]  COLONOSCOPY PROCEDURE REPORT  PATIENT:  Rachel Wood, Rachel Wood  MR#:  [id number] BIRTHDATE:  1950-07-18, 60 yrs. old  GENDER:  female  ENDOSCOPIST:  Jonette Eva, MD REF. BY:  Glenford Peers, M.D. Catalina Pizza, M.D. ASSISTANT:  PROCEDURE DATE:  12/24/2011 PROCEDURE:  Colonoscopy 62130  INDICATIONS:  Screening  MEDICATIONS:   Demerol 50 mg IV, Versed 4 mg IV  DESCRIPTION OF PROCEDURE:    Physical exam was performed. Informed consent was obtained from the patient after explaining the benefits, risks, and alternatives to procedure.  The patient was connected to monitor and placed in left lateral position. Continuous oxygen was provided by nasal cannula and IV medicine administered through an indwelling cannula.  After administration of sedation and rectal exam, the patient's rectum was intubated and the EC-[id number] ([id number]) colonoscope was advanced under direct visualization to the cecum.  The scope was removed slowly by carefully examining the color, texture, anatomy, and integrity mucosa on the way out.  The patient was recovered in endoscopy and discharged home in satisfactory condition. <<PROCEDUREIMAGES>>  FINDINGS:  Normal colonoscopy without polyps, masses, vascular ectasias, or inflammatory changes.  SMALL Internal Hemorrhoids were found.  PREP QUALITY: EXCELLENT CECAL W/D TIME:    12 minutes  COMPLICATIONS:    None  ENDOSCOPIC IMPRESSION: 1) Internal hemorrhoids  RECOMMENDATIONS: HIGH FIBER DIET TCS IN 10 YEARS  REPEAT EXAM:  No  ______________________________ Jonette Eva, MD  CC:  Glenford Peers, M.D. Catalina Pizza, M.D.  n. eSIGNEDDuncan Dull Hannah Crill at 12/24/2011 09:40 AM  Edmonia James, [phone]

## 2011-12-24 NOTE — Discharge Instructions (Signed)
You have internal hemorrhoids. YOU DID NOT HAVE ANY POLYPS. ° ° °FOLLOW A HIGH FIBER DIET. AVOID ITEMS THAT CAUSE BLOATING. SEE INFO BELOW. ° °Next colonoscopy in 10 years. ° ° °Colonoscopy °Care After °Read the instructions outlined below and refer to this sheet in the next week. These discharge instructions provide you with general information on caring for yourself after you leave the hospital. While your treatment has been planned according to the most current medical practices available, unavoidable complications occasionally occur. If you have any problems or questions after discharge, call DR. Delynda Sepulveda, [phone]. ° °ACTIVITY °· You may resume your regular activity, but move at a slower pace for the next 24 hours.  °· Take frequent rest periods for the next 24 hours.  °· Walking will help get rid of the air and reduce the bloated feeling in your belly (abdomen).  °· No driving for 24 hours (because of the medicine (anesthesia) used during the test).  °· You may shower.  °· Do not sign any important legal documents or operate any machinery for 24 hours (because of the anesthesia used during the test).  °·  °NUTRITION °· Drink plenty of fluids.  °· You may resume your normal diet as instructed by your doctor.  °· Begin with a light meal and progress to your normal diet. Heavy or fried foods are harder to digest and may make you feel sick to your stomach (nauseated).  °· Avoid alcoholic beverages for 24 hours or as instructed.  °·  °MEDICATIONS °· You may resume your normal medications. °·  °WHAT YOU CAN EXPECT TODAY °· Some feelings of bloating in the abdomen.  °· Passage of more gas than usual.  °· Spotting of blood in your stool or on the toilet paper °· .  °IF YOU HAD POLYPS REMOVED DURING THE COLONOSCOPY: °· Eat a soft diet IF YOU HAVE NAUSEA, BLOATING, ABDOMINAL PAIN, OR VOMITING. °·   °FINDING OUT THE RESULTS OF YOUR TEST °Not all test results are available during your visit. DR. Sahvanna Mcmanigal WILL CALL YOU  WITHIN 7 DAYS OF YOUR PROCEDUE WITH YOUR RESULTS. Do not assume everything is normal if you have not heard from DR. Collen Hostler IN ONE WEEK, CALL HER OFFICE AT [phone]. ° °SEEK IMMEDIATE MEDICAL ATTENTION AND CALL THE OFFICE: [phone] IF: °· You have more than a spotting of blood in your stool.  °· Your belly is swollen (abdominal distention).  °· You are nauseated or vomiting.  °· You have a temperature over 101F.  °· You have abdominal pain or discomfort that is severe or gets worse throughout the day. ° °High-Fiber Diet °A high-fiber diet changes your normal diet to include more whole grains, legumes, fruits, and vegetables. Changes in the diet involve replacing refined carbohydrates with unrefined foods. The calorie level of the diet is essentially unchanged. The Dietary Reference Intake (recommended amount) for adult males is 38 grams per day. For adult females, it is 25 grams per day. Pregnant and lactating women should consume 28 grams of fiber per day. °Fiber is the intact part of a plant that is not broken down during digestion. Functional fiber is fiber that has been isolated from the plant to provide a beneficial effect in the body. °PURPOSE °· Increase stool bulk.  °· Ease and regulate bowel movements.  °· Lower cholesterol.  °INDICATIONS THAT YOU NEED MORE FIBER °· Constipation and hemorrhoids.  °· Uncomplicated diverticulosis (intestine condition) and irritable bowel syndrome.  °· Weight management.  °· As   a protective measure against hardening of the arteries (atherosclerosis), diabetes, and cancer.  ° °GUIDELINES FOR INCREASING FIBER IN THE DIET °· Start adding fiber to the diet slowly. A gradual increase of about 5 more grams (2 slices of whole-wheat bread, 2 servings of most fruits or vegetables, or 1 bowl of high-fiber cereal) per day is best. Too rapid an increase in fiber may result in constipation, flatulence, and bloating.  °· Drink enough water and fluids to keep your urine clear or pale  yellow. Water, juice, or caffeine-free drinks are recommended. Not drinking enough fluid may cause constipation.  °· Eat a variety of high-fiber foods rather than one type of fiber.  °· Try to increase your intake of fiber through using high-fiber foods rather than fiber pills or supplements that contain small amounts of fiber.  °· The goal is to change the types of food eaten. Do not supplement your present diet with high-fiber foods, but replace foods in your present diet.  °INCLUDE A VARIETY OF FIBER SOURCES °· Replace refined and processed grains with whole grains, canned fruits with fresh fruits, and incorporate other fiber sources. White rice, white breads, and most bakery goods contain little or no fiber.  °· Brown whole-grain rice, buckwheat oats, and many fruits and vegetables are all good sources of fiber. These include: broccoli, Brussels sprouts, cabbage, cauliflower, beets, sweet potatoes, white potatoes (skin on), carrots, tomatoes, eggplant, squash, berries, fresh fruits, and dried fruits.  °· Cereals appear to be the richest source of fiber. Cereal fiber is found in whole grains and bran. Bran is the fiber-rich outer coat of cereal grain, which is largely removed in refining. In whole-grain cereals, the bran remains. In breakfast cereals, the largest amount of fiber is found in those with "bran" in their names. The fiber content is sometimes indicated on the label.  °· You may need to include additional fruits and vegetables each day.  °· In baking, for 1 cup white flour, you may use the following substitutions:  °· 1 cup whole-wheat flour minus 2 tablespoons.  °· 1/2 cup white flour plus 1/2 cup whole-wheat flour.  ° °Hemorrhoids °Hemorrhoids are dilated (enlarged) veins around the rectum. Sometimes clots will form in the veins. This makes them swollen and painful. These are called thrombosed hemorrhoids. °Causes of hemorrhoids include: °· Constipation.  °· Straining to have a bowel movement. °·   HEAVY LIFTING °HOME CARE INSTRUCTIONS °· Eat a well balanced diet and drink 6 to 8 glasses of water every day to avoid constipation. You may also use a bulk laxative.  °· Avoid straining to have bowel movements.  °· Keep anal area dry and clean.  °· Do not use a donut shaped pillow or sit on the toilet for long periods. This increases blood pooling and pain.  °· Move your bowels when your body has the urge; this will require less straining and will decrease pain and pressure.  ° °

## 2011-12-25 ENCOUNTER — Encounter (HOSPITAL_COMMUNITY): Payer: Self-pay | Admitting: Gastroenterology

## 2011-12-31 ENCOUNTER — Encounter (HOSPITAL_COMMUNITY): Payer: Managed Care, Other (non HMO) | Attending: Oncology | Admitting: Oncology

## 2011-12-31 ENCOUNTER — Encounter (HOSPITAL_COMMUNITY): Payer: Self-pay | Admitting: Oncology

## 2011-12-31 VITALS — BP 131/77 | HR 52 | Temp 97.9°F | Ht 63.0 in | Wt 169.3 lb

## 2011-12-31 DIAGNOSIS — Z170 Estrogen receptor positive status [ER+]: Secondary | ICD-10-CM

## 2011-12-31 DIAGNOSIS — C50919 Malignant neoplasm of unspecified site of unspecified female breast: Secondary | ICD-10-CM

## 2011-12-31 DIAGNOSIS — C50219 Malignant neoplasm of upper-inner quadrant of unspecified female breast: Secondary | ICD-10-CM

## 2011-12-31 NOTE — Progress Notes (Signed)
CC:   Rachel Wood, M.D. Maryln Gottron, M.D.  DIAGNOSES: 1. 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  Her date of surgery was 12/29/2002.  All     therapy finished as of 08/04/2003.  She then tried an AI, we tried     all 3 AIs at that time without success.  Due to severe myalgias and     arthralgias it was discontinued then we tried tamoxifen.  She had a     TIA (transient ischemic attack) in May of 2007.  We stopped the     tamoxifen at that time.  She has had no further neurological issues     and we have not rechallenged her.  She is now out of course a     number of years. 2. Transient ischemic attack. 3. Two tick bites removed today, one on the abdomen right upper     quadrant and left upper back. 4. Melanoma left upper chest wall in 2011.  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  The melanoma was 0.14 mm and     re-excision showed no residual disease. 5. Recent colonoscopy was negative.  That was her first one ever.  It     was clear. 6. Cholecystectomy in the past. 7. Depression on Paxil with excellent response. 8. Codeine intolerance. 9. Hypertension. 10.Nausea and vomiting during her pregnancies which was severe. 11.Uterine thickening in the past seen on a CT scan with a biopsy that     was benign. 12.Tracheotomy in the past for an impacted pinto bean ingested as a     child.  She is doing well but has on physical exam 2 ticks, one on the right upper abdomen and one on the left upper back.  We removed those and went over the symptoms to watch for:  Headaches, fevers, nuchal stiffness and rash at the wrists, fever, etc.  She is going to look for this in the next 72-96 hours essentially, namely a week.  She was out in her garden with her grand kids and did not  know she had these ticks on her.  From the breast cancer standpoint she has a negative oncologic review of systems.  The good news is she had colonoscopy finally last month by Dr. Jonette Eva and that was perfectly negative.  In fact I think it was done just this month.  That was on June 10.  She does not need another one for 10 years.  She is otherwise doing well.  PHYSICAL EXAMINATION:  Shows stable vital signs.  Lymph nodes that are negative throughout.  Scars that are all well-healed.  The breast exam is negative.  She has no nodularity.  Lungs are clear.  Heart shows a regular rhythm and rate without murmur, rub or gallop.  Abdomen is soft, nontender, without organomegaly.  She has no peripheral edema.  We will see her back in a year.  Dr. Margo Aye will be seeing her and we will let him do the blood work.   ______________________________ Ladona Horns. Mariel Sleet, MD ESN/MEDQ  D:  12/31/2011  T:  12/31/2011  Job:  [id number]

## 2011-12-31 NOTE — Progress Notes (Signed)
This office note has been dictated.

## 2011-12-31 NOTE — Patient Instructions (Signed)
Rachel Wood  [id number] [birth year] Dr. Glenford Peers   [facility]  Discharge Instructions  RECOMMENDATIONS MADE BY THE CONSULTANT AND ANY TEST RESULTS WILL BE SENT TO YOUR REFERRING DOCTOR.   EXAM FINDINGS BY MD TODAY AND SIGNS AND SYMPTOMS TO REPORT TO CLINIC OR PRIMARY MD: Exam findings are good as discussed by Dr. Mariel Sleet.  SPECIAL INSTRUCTIONS/FOLLOW-UP: 1.  Please keep your 1 year appointment with [place]. 2.  Make an appointment with Dr. Margo Aye for a physical. 3.  Since we found and pulled a tick from your abdomen, please seek medical care if you develop headaches, fevers, or a rash - usually seen within 1 week of the tick bite.  I acknowledge that I have been informed and understand all the instructions given to me and received a copy. I do not have any more questions at this time, but understand that I may call the Specialty [facility] at [facility] at [phone] during business hours should I have any further questions or need assistance in obtaining follow-up care.    __________________________________________  _____________  __________ Signature of Patient or Authorized Representative            Date                   Time    __________________________________________ Nurse's Signature

## 2012-12-25 ENCOUNTER — Other Ambulatory Visit (HOSPITAL_COMMUNITY): Payer: Self-pay | Admitting: Family Medicine

## 2012-12-25 DIAGNOSIS — Z139 Encounter for screening, unspecified: Secondary | ICD-10-CM

## 2012-12-30 ENCOUNTER — Ambulatory Visit (HOSPITAL_COMMUNITY): Payer: Managed Care, Other (non HMO)

## 2012-12-30 ENCOUNTER — Ambulatory Visit (HOSPITAL_COMMUNITY)
Admission: RE | Admit: 2012-12-30 | Discharge: 2012-12-30 | Disposition: A | Discharge status: Home / Self Care | Payer: Managed Care, Other (non HMO) | Source: Ambulatory Visit | Attending: Family Medicine | Admitting: Family Medicine

## 2012-12-30 DIAGNOSIS — Z139 Encounter for screening, unspecified: Secondary | ICD-10-CM

## 2012-12-30 DIAGNOSIS — Z1231 Encounter for screening mammogram for malignant neoplasm of breast: Secondary | ICD-10-CM | POA: Insufficient documentation

## 2013-01-26 ENCOUNTER — Telehealth (HOSPITAL_COMMUNITY): Payer: Self-pay | Admitting: *Deleted

## 2013-01-26 NOTE — Telephone Encounter (Signed)
Pt said she has been out for 10 years has decided she did not want to come back will contact us if she needs [place] in the future

## 2013-01-28 ENCOUNTER — Ambulatory Visit (HOSPITAL_COMMUNITY): Payer: Managed Care, Other (non HMO)

## 2014-02-11 ENCOUNTER — Other Ambulatory Visit (HOSPITAL_COMMUNITY): Payer: Self-pay | Admitting: Family Medicine

## 2014-02-11 DIAGNOSIS — Z139 Encounter for screening, unspecified: Secondary | ICD-10-CM

## 2014-02-18 ENCOUNTER — Ambulatory Visit (HOSPITAL_COMMUNITY)
Admission: RE | Admit: 2014-02-18 | Discharge: 2014-02-18 | Disposition: A | Discharge status: Home / Self Care | Payer: Managed Care, Other (non HMO) | Source: Ambulatory Visit | Attending: Family Medicine | Admitting: Family Medicine

## 2014-02-18 DIAGNOSIS — Z139 Encounter for screening, unspecified: Secondary | ICD-10-CM

## 2014-02-18 DIAGNOSIS — Z853 Personal history of malignant neoplasm of breast: Secondary | ICD-10-CM | POA: Insufficient documentation

## 2014-12-20 DIAGNOSIS — E785 Hyperlipidemia, unspecified: Secondary | ICD-10-CM | POA: Insufficient documentation

## 2014-12-20 DIAGNOSIS — Z85820 Personal history of malignant melanoma of skin: Secondary | ICD-10-CM | POA: Insufficient documentation

## 2015-02-28 ENCOUNTER — Other Ambulatory Visit (HOSPITAL_COMMUNITY): Payer: Self-pay | Admitting: Family Medicine

## 2015-02-28 DIAGNOSIS — Z1231 Encounter for screening mammogram for malignant neoplasm of breast: Secondary | ICD-10-CM

## 2015-03-10 ENCOUNTER — Ambulatory Visit (HOSPITAL_COMMUNITY)
Admission: RE | Admit: 2015-03-10 | Discharge: 2015-03-10 | Disposition: A | Discharge status: Home / Self Care | Payer: Managed Care, Other (non HMO) | Source: Ambulatory Visit | Attending: Family Medicine | Admitting: Family Medicine

## 2015-03-10 DIAGNOSIS — Z1231 Encounter for screening mammogram for malignant neoplasm of breast: Secondary | ICD-10-CM | POA: Insufficient documentation

## 2016-03-07 ENCOUNTER — Ambulatory Visit (INDEPENDENT_AMBULATORY_CARE_PROVIDER_SITE_OTHER): Payer: Managed Care, Other (non HMO) | Admitting: Orthopaedic Surgery

## 2016-03-07 ENCOUNTER — Ambulatory Visit (INDEPENDENT_AMBULATORY_CARE_PROVIDER_SITE_OTHER): Payer: Managed Care, Other (non HMO)

## 2016-03-07 ENCOUNTER — Encounter: Payer: Self-pay | Admitting: Orthopaedic Surgery

## 2016-03-07 VITALS — BP 125/75 | HR 59 | Temp 97.9°F | Ht 62.0 in | Wt 160.0 lb

## 2016-03-07 DIAGNOSIS — M25512 Pain in left shoulder: Secondary | ICD-10-CM

## 2016-03-07 DIAGNOSIS — I10 Essential (primary) hypertension: Secondary | ICD-10-CM | POA: Insufficient documentation

## 2016-03-07 NOTE — Progress Notes (Signed)
Subjective: I have a knot on top of my left shoulder    Patient ID: Rachel Wood, female    DOB: [birth year], 65 y.o.   MRN: [place]:[id number]  HPI She noticed a "knot" on top of her left shoulder at the [facility] joint about three weeks ago on 02-18-16.  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  She has to pull hard sometimes.  She fell about three weeks ago on an outstretched left hand after stumbling over a broken concrete area in the sidewalk.  She hit her shoulder as well in the fall.  She thought it would get better.  She has noticed the knot has not gone done and it is tender.  She has pain with overhead use and with pulling her relative at times.  She has no numbness. She has no redness.  She has used ice, heat and rubs and Advil with only slight help.   Review of Systems  HENT: Negative for congestion.   Respiratory: Negative for cough and shortness of breath.   Cardiovascular: Negative for chest pain and leg swelling.  Endocrine: Positive for cold intolerance.  Musculoskeletal: Positive for arthralgias.  Allergic/Immunologic: Positive for environmental allergies.   Past Medical History:  Diagnosis Date  . Cancer [facility]) 2004   Breast Cancer  . Constipation   . Depression   . Hypercholesteremia   . Hypertension   . Osteoporosis     Past Surgical History:  Procedure Laterality Date  . CHOLECYSTECTOMY    . COLONOSCOPY  12/24/2011   Procedure: COLONOSCOPY;  Surgeon: Danie Binder, MD;  [facility];  Service: Endoscopy;  Laterality: N/A;  8:30 AM  . Left breast lumpectomy  2004  . LYMPH GLAND EXCISION    . TRACHEOSTOMY     as a child    Current Outpatient Prescriptions on File Prior to Visit  Medication Sig Dispense Refill  . bisoprolol-hydrochlorothiazide (ZIAC) 2.5-6.25 MG per tablet Take 1 tablet by mouth every morning.     . Naproxen Sodium (ALEVE) 220 MG CAPS Take 440 mg by mouth every  morning.     Marland Kitchen PARoxetine (PAXIL) 10 MG tablet Take 10 mg by mouth every morning.     . valsartan-hydrochlorothiazide (DIOVAN-HCT) 160-12.5 MG per tablet Take 1 tablet by mouth every morning.      No current facility-administered medications on file prior to visit.     Social History   Social History  . Marital status: Married    Spouse name: N/A  . Number of children: N/A  . Years of education: N/A   Occupational History  . Not on file.   Social History Main Topics  . Smoking status: Never Smoker  . Smokeless tobacco: Never Used  . Alcohol use No  . Drug use: No  . Sexual activity: Not on file   Other Topics Concern  . Not on file   Social History Narrative  . No narrative on file    Family History  Problem Relation Age of Onset  . Heart disease Mother   . Hypertension Mother   . Osteoporosis Mother     BP 125/75   Pulse (!) 59   Temp 97.9 F (36.6 C)   Ht 5\' 2"  (1.575 m)   Wt 160 lb (72.6 kg)   BMI 29.26 kg/m      Objective:   Physical Exam  Constitutional: She is  oriented to person, place, and time. She appears well-developed and well-nourished.  HENT:  Head: Normocephalic and atraumatic.  Eyes: Conjunctivae and EOM are normal. Pupils are equal, round, and reactive to light.  Neck: Normal range of motion. Neck supple.  Cardiovascular: Normal rate, regular rhythm and intact distal pulses.   Pulmonary/Chest: Effort normal.  Abdominal: Soft.  Musculoskeletal: She exhibits tenderness (The left AC joint is prominent wtih distal clavicle slightly high riding and tender.  No redness is present.  ROM of the shoulder is full but tender. Grips normal.  NV intact.  Right negative.  Neck negative.).  Neurological: She is alert and oriented to person, place, and time. She displays normal reflexes. No cranial nerve deficit. She exhibits normal muscle tone. Coordination normal.  Skin: Skin is warm and dry.  Psychiatric: She has a normal mood and affect. Her behavior  is normal. Judgment and thought content normal.  Vitals reviewed.  X-rays were taken, reported separately.       Assessment & Plan:   Encounter Diagnoses  Name Primary?  . Left shoulder pain Yes  . Essential hypertension    I have shown her the x-rays and a model drawing of the shoulder. I answered her questions.  It will take a few more weeks for the pain to subside.  I have recommended Aleve one to two twice a day after eating.  No surgery is needed.  She may have permanent elevation of the clavicle as I feel she has a Grade II AC separation injury.  Return in two weeks.  Call if any problem.  Precautions discussed.  Electronically Signed Sanjuana Kava, MD 8/23/20174:20 PM

## 2016-03-27 ENCOUNTER — Ambulatory Visit: Payer: Managed Care, Other (non HMO) | Admitting: Orthopedic Surgery

## 2017-01-24 ENCOUNTER — Other Ambulatory Visit (HOSPITAL_COMMUNITY): Payer: Self-pay | Admitting: Family Medicine

## 2017-01-24 DIAGNOSIS — Z1231 Encounter for screening mammogram for malignant neoplasm of breast: Secondary | ICD-10-CM

## 2017-01-30 ENCOUNTER — Ambulatory Visit (HOSPITAL_COMMUNITY): Payer: Managed Care, Other (non HMO)

## 2017-05-01 ENCOUNTER — Encounter (HOSPITAL_COMMUNITY): Payer: Self-pay

## 2017-05-01 ENCOUNTER — Ambulatory Visit (HOSPITAL_COMMUNITY)
Admission: RE | Admit: 2017-05-01 | Discharge: 2017-05-01 | Disposition: A | Discharge status: Home / Self Care | Payer: Managed Care, Other (non HMO) | Source: Ambulatory Visit | Attending: Family Medicine | Admitting: Family Medicine

## 2017-05-01 DIAGNOSIS — Z1231 Encounter for screening mammogram for malignant neoplasm of breast: Secondary | ICD-10-CM | POA: Insufficient documentation

## 2017-05-01 HISTORY — DX: Personal history of irradiation: Z92.3

## 2017-05-01 HISTORY — DX: Personal history of antineoplastic chemotherapy: Z92.21

## 2018-05-01 DIAGNOSIS — K219 Gastro-esophageal reflux disease without esophagitis: Secondary | ICD-10-CM | POA: Insufficient documentation

## 2018-06-16 ENCOUNTER — Other Ambulatory Visit (HOSPITAL_COMMUNITY): Payer: Self-pay | Admitting: Family Medicine

## 2018-06-16 DIAGNOSIS — Z1231 Encounter for screening mammogram for malignant neoplasm of breast: Secondary | ICD-10-CM

## 2018-06-19 ENCOUNTER — Ambulatory Visit (HOSPITAL_COMMUNITY)
Admission: RE | Admit: 2018-06-19 | Discharge: 2018-06-19 | Disposition: A | Discharge status: Home / Self Care | Payer: Managed Care, Other (non HMO) | Source: Ambulatory Visit | Attending: Family Medicine | Admitting: Family Medicine

## 2018-06-19 DIAGNOSIS — Z1231 Encounter for screening mammogram for malignant neoplasm of breast: Secondary | ICD-10-CM | POA: Diagnosis present

## 2019-01-08 ENCOUNTER — Ambulatory Visit (INDEPENDENT_AMBULATORY_CARE_PROVIDER_SITE_OTHER): Payer: Managed Care, Other (non HMO) | Admitting: Orthopaedic Surgery

## 2019-01-08 ENCOUNTER — Encounter: Payer: Self-pay | Admitting: Orthopaedic Surgery

## 2019-01-08 ENCOUNTER — Other Ambulatory Visit: Payer: Self-pay

## 2019-01-08 ENCOUNTER — Ambulatory Visit (INDEPENDENT_AMBULATORY_CARE_PROVIDER_SITE_OTHER): Payer: Managed Care, Other (non HMO)

## 2019-01-08 ENCOUNTER — Telehealth: Payer: Self-pay | Admitting: Orthopaedic Surgery

## 2019-01-08 VITALS — BP 144/80 | HR 64 | Temp 97.0°F | Ht 63.0 in | Wt 160.0 lb

## 2019-01-08 DIAGNOSIS — M5410 Radiculopathy, site unspecified: Secondary | ICD-10-CM

## 2019-01-08 DIAGNOSIS — M4316 Spondylolisthesis, lumbar region: Secondary | ICD-10-CM

## 2019-01-08 MED ORDER — PREDNISONE 5 MG (21) PO TBPK: ORAL_TABLET | ORAL | 0 refills | Status: DC

## 2019-01-08 NOTE — Telephone Encounter (Signed)
Mrs. Steedman was seen by you this morning.  She said you felt that she needed an MRI but that her insurance would not approve it until she has been treated for at least 6 weeks.  She said she spoke to her insurance company and they asked that we go ahead and put in to them for the MRI so thy could review.  Do you want to go ahead and order the MRI now?

## 2019-01-08 NOTE — Progress Notes (Signed)
Patient Rachel Wood, female DOB:Feb 12, 1951, 68 y.o. MWU:[id number]  Chief Complaint  Patient presents with  . Leg Pain    right thigh    HPI  Rachel Wood is a 68 y.o. female who has right hip and leg and thigh pain. She has no trauma. She has pain when getting up and walking but no other pain She has pain that goes to the right knee and just beyond. She has no weakness but has some numbness. She has no trauma.  She has tried heat, ice, Advil with no help.   Body mass index is 28.34 kg/m.  ROS  Review of Systems  Constitutional: Positive for activity change.  Musculoskeletal: Positive for arthralgias, back pain and gait problem.  All other systems reviewed and are negative.   All other systems reviewed and are negative.  The following is a summary of the past history medically, past history surgically, known current medicines, social history and family history.  This information is gathered electronically by the computer from prior information and documentation.  I review this each visit and have found including this information at this point in the chart is beneficial and informative.    Past Medical History:  Diagnosis Date  . Cancer [facility]) 2004   Breast Cancer  . Constipation   . Depression   . Hypercholesteremia   . Hypertension   . Osteoporosis   . Personal history of chemotherapy 2004  . Personal history of radiation therapy 2004    Past Surgical History:  Procedure Laterality Date  . CHOLECYSTECTOMY    . COLONOSCOPY  12/24/2011   Procedure: COLONOSCOPY;  Surgeon: Danie Binder, MD;  [facility];  Service: Endoscopy;  Laterality: N/A;  8:30 AM  . Left breast lumpectomy  2004  . LYMPH GLAND EXCISION    . TRACHEOSTOMY     as a child    Family History  Problem Relation Age of Onset  . Heart disease Mother   . Hypertension Mother   . Osteoporosis Mother     Social History Social History   Tobacco Use  . Smoking status: Never Smoker   . Smokeless tobacco: Never Used  Substance Use Topics  . Alcohol use: No  . Drug use: No    Allergies  Allergen Reactions  . Codeine Nausea And Vomiting    Needs pre meds    Current Outpatient Medications  Medication Sig Dispense Refill  . bisoprolol-hydrochlorothiazide (ZIAC) 2.5-6.25 MG per tablet Take 1 tablet by mouth every morning.     . fenofibrate 160 MG tablet     . Naproxen Sodium (ALEVE) 220 MG CAPS Take 440 mg by mouth every morning.     Marland Kitchen PARoxetine (PAXIL) 10 MG tablet Take 10 mg by mouth every morning.     . valsartan-hydrochlorothiazide (DIOVAN-HCT) 160-12.5 MG per tablet Take 1 tablet by mouth every morning.     . predniSONE (STERAPRED UNI-PAK 21 TAB) 5 MG (21) TBPK tablet Take 6 pills first day; 5 pills second day; 4 pills third day; 3 pills fourth day; 2 pills next day and 1 pill last day. 21 tablet 0   No current facility-administered medications for this visit.      Physical Exam  Blood pressure (!) 144/80, pulse 64, temperature (!) 97 F (36.1 C), height 5\' 3"  (1.6 m), weight 160 lb (72.6 kg).  Constitutional: overall normal hygiene, normal nutrition, well developed, normal grooming, normal body habitus. Assistive device:none  Musculoskeletal: gait and station Limp  right, muscle tone and strength are normal, no tremors or atrophy is present.  .  Neurological: coordination overall normal.  Deep tendon reflex/nerve stretch intact.  Sensation normal.  Cranial nerves II-XII intact.   Skin:   Normal overall no scars, lesions, ulcers or rashes. No psoriasis.  Psychiatric: Alert and oriented x 3.  Recent memory intact, remote memory unclear.  Normal mood and affect. Well groomed.  Good eye contact.  Cardiovascular: overall no swelling, no varicosities, no edema bilaterally, normal temperatures of the legs and arms, no clubbing, cyanosis and good capillary refill.  Lymphatic: palpation is normal.  Spine/Pelvis examination:  Inspection:  Overall, sacoiliac  joint benign and hips nontender; without crepitus or defects.   Thoracic spine inspection: Alignment normal without kyphosis present   Lumbar spine inspection:  Alignment  with normal lumbar lordosis, without scoliosis apparent.   Thoracic spine palpation:  without tenderness of spinal processes   Lumbar spine palpation: without tenderness of lumbar area; without tightness of lumbar muscles    Range of Motion:   Lumbar flexion, forward flexion is normal without pain or tenderness    Lumbar extension is full without pain or tenderness   Left lateral bend is normal without pain or tenderness   Right lateral bend is normal without pain or tenderness   Straight leg raising is normal  Strength & tone: normal   Stability overall normal stability  All other systems reviewed and are negative   The patient has been educated about the nature of the problem(s) and counseled on treatment options.  The patient appeared to understand what I have discussed and is in agreement with it.  Encounter Diagnosis  Name Primary?  . Radicular pain of right lower extremity Yes   X-rays were done of the lumbar spine, reported separately.  PLAN Call if any problems.  Precautions discussed.  Continue current medications. I will begin prednisone dose pack.  Precautions discussed.  She may need MRI. She has spondylolisthesis present.  Return to clinic 2 weeks   Electronically Signed Sanjuana Kava, MD 6/25/202011:04 AM

## 2019-01-12 NOTE — Telephone Encounter (Signed)
Try to get the MRI.

## 2019-01-14 NOTE — Telephone Encounter (Signed)
MRI ordered and approved for [facility]. VM left with pt to call if any questions or concerns.

## 2019-01-15 NOTE — Telephone Encounter (Signed)
Spoke with pt and she would like to change the facility from  [place]. Called [employer] to change facility but was unable to at this time. [employer] gave me a # [phone] have pt call to change facility. Left pt a VM with # and callback for the office.

## 2019-01-15 NOTE — Telephone Encounter (Signed)
Patient returned call; states received the call regarding MRI from [place] needed*  - states would like to have at [facility] if possible. Relayed that insurance approved for this location - please advise.

## 2019-01-22 ENCOUNTER — Encounter: Payer: Self-pay | Admitting: Orthopaedic Surgery

## 2019-01-22 ENCOUNTER — Ambulatory Visit: Payer: Managed Care, Other (non HMO) | Admitting: Orthopaedic Surgery

## 2019-01-22 ENCOUNTER — Other Ambulatory Visit: Payer: Self-pay

## 2019-01-22 VITALS — BP 163/71 | HR 48 | Temp 97.2°F | Ht 63.0 in | Wt 165.0 lb

## 2019-01-22 DIAGNOSIS — M4316 Spondylolisthesis, lumbar region: Secondary | ICD-10-CM | POA: Diagnosis not present

## 2019-01-22 DIAGNOSIS — M5410 Radiculopathy, site unspecified: Secondary | ICD-10-CM | POA: Diagnosis not present

## 2019-01-22 NOTE — Progress Notes (Signed)
Patient Rachel Wood, female DOB:[birth year], 68 y.o. VHQ:[id number]  Chief Complaint  Patient presents with  . Back Pain    Lower back pain    HPI  Rachel Wood is a 68 y.o. female who has lower back pain.  She is a little better after the prednisone. She did not go to [facility] for the MRI as she wanted to go to [facility] and had a mixup with her insurance company. She will call them and see how this can be corrected.  She does need a MRI.  She has no weakness.   Body mass index is 29.23 kg/m.  ROS  Review of Systems  Constitutional: Positive for activity change.  Musculoskeletal: Positive for arthralgias, back pain and gait problem.  All other systems reviewed and are negative.   All other systems reviewed and are negative.  The following is a summary of the past history medically, past history surgically, known current medicines, social history and family history.  This information is gathered electronically by the computer from prior information and documentation.  I review this each visit and have found including this information at this point in the chart is beneficial and informative.    Past Medical History:  Diagnosis Date  . Cancer [facility]) 2004   Breast Cancer  . Constipation   . Depression   . Hypercholesteremia   . Hypertension   . Osteoporosis   . Personal history of chemotherapy 2004  . Personal history of radiation therapy 2004    Past Surgical History:  Procedure Laterality Date  . CHOLECYSTECTOMY    . COLONOSCOPY  12/24/2011   Procedure: COLONOSCOPY;  Surgeon: Danie Binder, MD;  [facility];  Service: Endoscopy;  Laterality: N/A;  8:30 AM  . Left breast lumpectomy  2004  . LYMPH GLAND EXCISION    . TRACHEOSTOMY     as a child    Family History  Problem Relation Age of Onset  . Heart disease Mother   . Hypertension Mother   . Osteoporosis Mother     Social History Social History   Tobacco Use  . Smoking status: Never  Smoker  . Smokeless tobacco: Never Used  Substance Use Topics  . Alcohol use: No  . Drug use: No    Allergies  Allergen Reactions  . Codeine Nausea And Vomiting    Needs pre meds    Current Outpatient Medications  Medication Sig Dispense Refill  . bisoprolol-hydrochlorothiazide (ZIAC) 2.5-6.25 MG per tablet Take 1 tablet by mouth every morning.     . fenofibrate 160 MG tablet     . Naproxen Sodium (ALEVE) 220 MG CAPS Take 440 mg by mouth every morning.     Marland Kitchen PARoxetine (PAXIL) 10 MG tablet Take 10 mg by mouth every morning.     . predniSONE (STERAPRED UNI-PAK 21 TAB) 5 MG (21) TBPK tablet Take 6 pills first day; 5 pills second day; 4 pills third day; 3 pills fourth day; 2 pills next day and 1 pill last day. 21 tablet 0  . valsartan-hydrochlorothiazide (DIOVAN-HCT) 160-12.5 MG per tablet Take 1 tablet by mouth every morning.      No current facility-administered medications for this visit.      Physical Exam  Blood pressure (!) 163/71, pulse (!) 48, temperature (!) 97.2 F (36.2 C), height 5\' 3"  (1.6 m), weight 165 lb (74.8 kg).  Constitutional: overall normal hygiene, normal nutrition, well developed, normal grooming, normal body habitus. Assistive device:none  Musculoskeletal: gait  and station Limp none, muscle tone and strength are normal, no tremors or atrophy is present.  .  Neurological: coordination overall normal.  Deep tendon reflex/nerve stretch intact.  Sensation normal.  Cranial nerves II-XII intact.   Skin:   Normal overall no scars, lesions, ulcers or rashes. No psoriasis.  Psychiatric: Alert and oriented x 3.  Recent memory intact, remote memory unclear.  Normal mood and affect. Well groomed.  Good eye contact.  Cardiovascular: overall no swelling, no varicosities, no edema bilaterally, normal temperatures of the legs and arms, no clubbing, cyanosis and good capillary refill.  Lymphatic: palpation is normal.  Spine/Pelvis examination:  Inspection:   Overall, sacoiliac joint benign and hips nontender; without crepitus or defects.   Thoracic spine inspection: Alignment normal without kyphosis present   Lumbar spine inspection:  Alignment  with normal lumbar lordosis, without scoliosis apparent.   Thoracic spine palpation:  without tenderness of spinal processes   Lumbar spine palpation: without tenderness of lumbar area; without tightness of lumbar muscles    Range of Motion:   Lumbar flexion, forward flexion is normal without pain or tenderness    Lumbar extension is full without pain or tenderness   Left lateral bend is normal without pain or tenderness   Right lateral bend is normal without pain or tenderness   Straight leg raising is normal  Strength & tone: normal   Stability overall normal stability  All other systems reviewed and are negative   The patient has been educated about the nature of the problem(s) and counseled on treatment options.  The patient appeared to understand what I have discussed and is in agreement with it.  Encounter Diagnoses  Name Primary?  . Spondylolisthesis of lumbar region Yes  . Radicular pain of right lower extremity     PLAN Call if any problems.  Precautions discussed.  Continue current medications.   Return to clinic AFter MRi of lumbar spine   Electronically Signed Sanjuana Kava, MD 7/9/20209:20 AM

## 2019-02-07 ENCOUNTER — Other Ambulatory Visit: Payer: Managed Care, Other (non HMO)

## 2019-03-18 ENCOUNTER — Other Ambulatory Visit: Payer: Self-pay

## 2019-03-18 ENCOUNTER — Ambulatory Visit
Admission: RE | Admit: 2019-03-18 | Discharge: 2019-03-18 | Disposition: A | Discharge status: Home / Self Care | Payer: Managed Care, Other (non HMO) | Source: Ambulatory Visit | Attending: Orthopaedic Surgery | Admitting: Orthopaedic Surgery

## 2019-03-18 DIAGNOSIS — M5410 Radiculopathy, site unspecified: Secondary | ICD-10-CM

## 2019-03-25 ENCOUNTER — Encounter: Payer: Self-pay | Admitting: Orthopaedic Surgery

## 2019-03-25 ENCOUNTER — Other Ambulatory Visit: Payer: Self-pay

## 2019-03-25 ENCOUNTER — Ambulatory Visit (INDEPENDENT_AMBULATORY_CARE_PROVIDER_SITE_OTHER): Payer: Medicare Other | Admitting: Orthopaedic Surgery

## 2019-03-25 ENCOUNTER — Other Ambulatory Visit: Payer: Self-pay | Admitting: Orthopaedic Surgery

## 2019-03-25 DIAGNOSIS — M5410 Radiculopathy, site unspecified: Secondary | ICD-10-CM

## 2019-03-25 DIAGNOSIS — M4316 Spondylolisthesis, lumbar region: Secondary | ICD-10-CM

## 2019-03-25 NOTE — Progress Notes (Signed)
Virtual Visit via Telephone Note  I connected with@ on 03/25/19 at  9:00 AM EDT by telephone and verified that I am speaking with the correct person using two identifiers.  Location: Patient: home Provider: home   I discussed the limitations, risks, security and privacy concerns of performing an evaluation and management service by telephone and the availability of in person appointments. I also discussed with the patient that there may be a patient responsible charge related to this service. The patient expressed understanding and agreed to proceed.   History of Present Illness: She has continued lower back pain with right sided numbness.  She has no new trauma.  She has less pain but the numbness bothers her a lot.  She had the MRI done and it showed: IMPRESSION: Lumbar spine spondylosis most notable at L4-L5 and L5-S1 with moderate bilateral neural foraminal narrowing. No central canal Stenosis.  I have explained the results to her.  I have talked about possible epidural injections.  She would like to proceed with this.  I will arrange.  I will see her in about six weeks.  Hopefully she will have had at least one injection by then.  She does not need any more medication at this time.   Observations/Objective: Per above.  Assessment and Plan: Encounter Diagnoses  Name Primary?  . Radicular pain of right lower extremity Yes  . Spondylolisthesis of lumbar region      Follow Up Instructions: Six weeks.  Get the epidural injections.   I discussed the assessment and treatment plan with the patient. The patient was provided an opportunity to ask questions and all were answered. The patient agreed with the plan and demonstrated an understanding of the instructions.   The patient was advised to call back or seek an in-person evaluation if the symptoms worsen or if the condition fails to improve as anticipated.  I provided 12 minutes of non-face-to-face time during this  encounter.   Sanjuana Kava, MD

## 2019-03-25 NOTE — Addendum Note (Signed)
Addended by: Derek Mound A on: 03/25/2019 02:04 PM   Modules accepted: Orders

## 2019-04-02 ENCOUNTER — Ambulatory Visit
Admission: RE | Admit: 2019-04-02 | Discharge: 2019-04-02 | Disposition: A | Discharge status: Home / Self Care | Payer: Medicare Other | Source: Ambulatory Visit | Attending: Orthopaedic Surgery | Admitting: Orthopaedic Surgery

## 2019-04-02 ENCOUNTER — Other Ambulatory Visit: Payer: Self-pay

## 2019-04-02 DIAGNOSIS — M4316 Spondylolisthesis, lumbar region: Secondary | ICD-10-CM

## 2019-04-02 MED ORDER — IOPAMIDOL (ISOVUE-M 200) INJECTION 41%
1.0000 mL | Freq: Once | INTRAMUSCULAR | Status: AC
  Administered 2019-04-02: 1 mL via EPIDURAL

## 2019-04-02 MED ORDER — METHYLPREDNISOLONE ACETATE 40 MG/ML INJ SUSP (RADIOLOG
120.0000 mg | Freq: Once | INTRAMUSCULAR | Status: AC
  Administered 2019-04-02: 120 mg via EPIDURAL

## 2019-04-02 NOTE — Discharge Instructions (Signed)

## 2019-05-12 ENCOUNTER — Other Ambulatory Visit (HOSPITAL_COMMUNITY): Payer: Self-pay | Admitting: Family Medicine

## 2019-05-12 DIAGNOSIS — Z1231 Encounter for screening mammogram for malignant neoplasm of breast: Secondary | ICD-10-CM

## 2019-06-24 ENCOUNTER — Ambulatory Visit (HOSPITAL_COMMUNITY): Payer: Medicare Other

## 2019-07-01 ENCOUNTER — Ambulatory Visit (HOSPITAL_COMMUNITY): Payer: Medicare Other

## 2019-07-16 ENCOUNTER — Ambulatory Visit (HOSPITAL_COMMUNITY)
Admission: RE | Admit: 2019-07-16 | Discharge: 2019-07-16 | Disposition: A | Discharge status: Home / Self Care | Payer: Medicare Other | Source: Ambulatory Visit | Attending: Family Medicine | Admitting: Family Medicine

## 2019-07-16 ENCOUNTER — Other Ambulatory Visit: Payer: Self-pay

## 2019-07-16 DIAGNOSIS — Z1231 Encounter for screening mammogram for malignant neoplasm of breast: Secondary | ICD-10-CM | POA: Diagnosis not present

## 2020-03-14 ENCOUNTER — Other Ambulatory Visit (HOSPITAL_COMMUNITY): Payer: Self-pay | Admitting: Sports Medicine

## 2020-03-14 DIAGNOSIS — M25561 Pain in right knee: Secondary | ICD-10-CM

## 2020-03-24 ENCOUNTER — Ambulatory Visit (HOSPITAL_COMMUNITY): Payer: Medicare Other

## 2020-04-13 ENCOUNTER — Other Ambulatory Visit: Payer: Self-pay

## 2020-04-13 ENCOUNTER — Ambulatory Visit (HOSPITAL_COMMUNITY)
Admission: RE | Admit: 2020-04-13 | Discharge: 2020-04-13 | Disposition: A | Discharge status: Home / Self Care | Payer: Medicare Other | Source: Ambulatory Visit | Attending: Sports Medicine | Admitting: Sports Medicine

## 2020-04-13 DIAGNOSIS — M25561 Pain in right knee: Secondary | ICD-10-CM | POA: Insufficient documentation

## 2020-04-28 ENCOUNTER — Ambulatory Visit: Payer: Medicare Other

## 2020-06-08 ENCOUNTER — Other Ambulatory Visit (HOSPITAL_COMMUNITY): Payer: Self-pay | Admitting: Family Medicine

## 2020-06-08 DIAGNOSIS — Z1231 Encounter for screening mammogram for malignant neoplasm of breast: Secondary | ICD-10-CM

## 2020-06-15 ENCOUNTER — Ambulatory Visit (HOSPITAL_COMMUNITY): Payer: Medicare Other

## 2020-06-23 ENCOUNTER — Ambulatory Visit (HOSPITAL_COMMUNITY): Payer: Medicare Other

## 2020-07-20 ENCOUNTER — Ambulatory Visit (HOSPITAL_COMMUNITY)
Admission: RE | Admit: 2020-07-20 | Discharge: 2020-07-20 | Disposition: A | Discharge status: Home / Self Care | Payer: Medicare Other | Source: Ambulatory Visit | Attending: Family Medicine | Admitting: Family Medicine

## 2020-07-20 ENCOUNTER — Other Ambulatory Visit: Payer: Self-pay

## 2020-07-20 DIAGNOSIS — Z1231 Encounter for screening mammogram for malignant neoplasm of breast: Secondary | ICD-10-CM | POA: Diagnosis not present

## 2020-09-12 ENCOUNTER — Telehealth (HOSPITAL_COMMUNITY): Payer: Self-pay | Admitting: Physical Therapy

## 2020-09-12 NOTE — Telephone Encounter (Signed)
pt cancelled appt because she had to cancel her surgery to take care of her mother

## 2020-09-19 ENCOUNTER — Ambulatory Visit (HOSPITAL_COMMUNITY): Payer: Medicare Other | Admitting: Physical Therapy

## 2020-09-21 ENCOUNTER — Encounter (HOSPITAL_COMMUNITY): Payer: Medicare Other | Admitting: Physical Therapy

## 2020-09-23 ENCOUNTER — Encounter (HOSPITAL_COMMUNITY): Payer: Medicare Other | Admitting: Physical Therapy

## 2020-09-26 ENCOUNTER — Encounter (HOSPITAL_COMMUNITY): Payer: Medicare Other | Admitting: Physical Therapy

## 2020-09-28 ENCOUNTER — Encounter (HOSPITAL_COMMUNITY): Payer: Medicare Other | Admitting: Physical Therapy

## 2020-10-03 ENCOUNTER — Encounter (HOSPITAL_COMMUNITY): Payer: Medicare Other | Admitting: Physical Therapy

## 2020-10-05 ENCOUNTER — Encounter (HOSPITAL_COMMUNITY): Payer: Medicare Other | Admitting: Physical Therapy

## 2020-10-07 ENCOUNTER — Encounter (HOSPITAL_COMMUNITY): Payer: Medicare Other | Admitting: Physical Therapy

## 2020-10-10 ENCOUNTER — Encounter (HOSPITAL_COMMUNITY): Payer: Medicare Other | Admitting: Physical Therapy

## 2020-10-12 ENCOUNTER — Encounter (HOSPITAL_COMMUNITY): Payer: Medicare Other

## 2020-10-14 ENCOUNTER — Encounter (HOSPITAL_COMMUNITY): Payer: Medicare Other

## 2020-10-17 ENCOUNTER — Encounter (HOSPITAL_COMMUNITY): Payer: Medicare Other | Admitting: Physical Therapy

## 2020-10-19 ENCOUNTER — Encounter (HOSPITAL_COMMUNITY): Payer: Medicare Other

## 2020-10-21 ENCOUNTER — Encounter (HOSPITAL_COMMUNITY): Payer: Medicare Other

## 2020-10-24 ENCOUNTER — Encounter (HOSPITAL_COMMUNITY): Payer: Medicare Other | Admitting: Physical Therapy

## 2020-10-26 ENCOUNTER — Encounter (HOSPITAL_COMMUNITY): Payer: Medicare Other

## 2020-10-31 ENCOUNTER — Other Ambulatory Visit: Payer: Self-pay

## 2020-10-31 ENCOUNTER — Encounter (HOSPITAL_COMMUNITY): Payer: Medicare Other | Admitting: Physical Therapy

## 2020-10-31 ENCOUNTER — Ambulatory Visit (HOSPITAL_COMMUNITY): Payer: Medicare Other | Attending: Orthopedic Surgery | Admitting: Physical Therapy

## 2020-10-31 ENCOUNTER — Encounter (HOSPITAL_COMMUNITY): Payer: Self-pay | Admitting: Physical Therapy

## 2020-10-31 DIAGNOSIS — R29898 Other symptoms and signs involving the musculoskeletal system: Secondary | ICD-10-CM | POA: Diagnosis present

## 2020-10-31 DIAGNOSIS — M6281 Muscle weakness (generalized): Secondary | ICD-10-CM | POA: Insufficient documentation

## 2020-10-31 DIAGNOSIS — R2689 Other abnormalities of gait and mobility: Secondary | ICD-10-CM | POA: Diagnosis present

## 2020-10-31 DIAGNOSIS — M25561 Pain in right knee: Secondary | ICD-10-CM | POA: Diagnosis present

## 2020-10-31 NOTE — Therapy (Signed)
[place], [place], [address] Phone: [phone]   Fax:  [phone]  Physical Therapy Evaluation  Patient Details  Name: Rachel Wood MRN: [id number] Date of Birth: March 30, 1951 Referring Provider (PT): Elsie Saas MD   Encounter Date: 10/31/2020   PT End of Session - 10/31/20 1505    Visit Number 1    Number of Visits 12    Date for PT Re-Evaluation 12/12/20    Authorization Type UHC Medicare (no auth, no VL)    Progress Note Due on Visit 10    PT Start Time 1445    PT Stop Time 1524    PT Time Calculation (min) 39 min    Activity Tolerance Patient tolerated treatment well    Behavior During Therapy [facility] for tasks assessed/performed           Past Medical History:  Diagnosis Date  . Cancer [facility]) 2004   Breast Cancer  . Constipation   . Depression   . Hypercholesteremia   . Hypertension   . Osteoporosis   . Personal history of chemotherapy 2004  . Personal history of radiation therapy 2004    Past Surgical History:  Procedure Laterality Date  . CHOLECYSTECTOMY    . COLONOSCOPY  12/24/2011   Procedure: COLONOSCOPY;  Surgeon: Danie Binder, MD;  [facility];  Service: Endoscopy;  Laterality: N/A;  8:30 AM  . Left breast lumpectomy  2004  . LYMPH GLAND EXCISION    . TRACHEOSTOMY     as a child    There were no vitals filed for this visit.    Subjective Assessment - 10/31/20 1446    Subjective Patient is a 70 y.o. female who presents to physical therapy s/p R TKA on 10/28/20. She had a very painful weekend. Normally she is very active. She has been having trouble with walking. She has been using CPM machine up until 90 degrees. She has been taking pain meds but she didn't take any before coming in. She has been using blue bone foam for extension. Patient states her main goals are to get back motion and get back into her yard.    Limitations House hold  activities;Walking;Standing;Lifting    Patient Stated Goals to get back motion and get back into her yard.    Currently in Pain? Yes    Pain Score 3     Pain Location Knee    Pain Orientation Right    Pain Descriptors / Indicators Burning    Pain Type Surgical pain    Pain Onset In the past 7 days    Pain Frequency Constant    Aggravating Factors  walking, standing    Pain Relieving Factors pain meds, rest              [facility] PT Assessment - 10/31/20 0001      Assessment   Medical Diagnosis s/p R TKA    Referring Provider (PT) Elsie Saas MD    Onset Date/Surgical Date 10/28/20    Next MD Visit 11/07/20    Prior Therapy none      Precautions   Precautions None      Restrictions   Weight Bearing Restrictions No      Balance Screen   Has the patient fallen in the past 6 months No    Has the patient had a decrease in activity level because of a fear of falling?  No    Is  the patient reluctant to leave their home because of a fear of falling?  No      Prior Function   Level of Independence Independent    Vocation Full time employment    Programmer, systems      Cognition   Overall Cognitive Status Within Functional Limits for tasks assessed      Observation/Other Assessments   Observations Antalgic gait with RW, sitting with knee in mid range position, labored movements    Focus on Therapeutic Outcomes (FOTO)  28% function      ROM / Strength   AROM / PROM / Strength AROM;Strength      AROM   AROM Assessment Site Knee    Right/Left Knee Right;Left    Right Knee Extension 5   lacking   Right Knee Flexion 97    Left Knee Extension 0    Left Knee Flexion 133      Strength   Strength Assessment Site Hip;Knee;Ankle    Right/Left Hip Right;Left    Right Hip Flexion 4-/5    Left Hip Flexion 4+/5    Right/Left Knee Right;Left    Right Knee Flexion 4+/5    Right Knee Extension 4/5    Left Knee Flexion 5/5    Left Knee Extension 5/5    Right/Left  Ankle Right;Left    Right Ankle Dorsiflexion 5/5    Left Ankle Dorsiflexion 5/5      Transfers   Comments labored, use of hands and relies on LLE      Ambulation/Gait   Ambulation/Gait Yes    Ambulation/Gait Assistance 6: Modified independent (Device/Increase time)    Ambulation Distance (Feet) 100 Feet    Assistive device Rolling walker    Gait Pattern Antalgic;Decreased hip/knee flexion - right;Right flexed knee in stance    Ambulation Surface Level;Indoor                      Objective measurements completed on examination: See above findings.       [facility] Adult PT Treatment/Exercise - 10/31/20 0001      Exercises   Exercises Knee/Hip      Knee/Hip Exercises: Stretches   Other Knee/Hip Stretches heel slides 10x 10 second holds      Knee/Hip Exercises: Supine   Quad Sets Right;10 reps    Quad Sets Limitations 10 second holds    Straight Leg Raises Right;1 set;10 reps                  PT Education - 10/31/20 1445    Education Details Patient educated on exam findings, POC, scope of PT, HEP, limiting knee in mid range positions    Person(s) Educated Patient    Methods Explanation;Demonstration;Handout    Comprehension Verbalized understanding;Returned demonstration            PT Short Term Goals - 10/31/20 1506      PT SHORT TERM GOAL #1   Title Patient will be independent with HEP in order to improve functional outcomes.    Time 3    Period Weeks    Status New    Target Date 11/21/20      PT SHORT TERM GOAL #2   Title Patient will report at least 25% improvement in symptoms for improved quality of life.    Time 3    Period Weeks    Status New    Target Date 11/21/20  PT Long Term Goals - 10/31/20 1507      PT LONG TERM GOAL #1   Title Patient will report at least 75% improvement in symptoms for improved quality of life.    Time 6    Period Weeks    Status New    Target Date 12/12/20      PT LONG TERM GOAL #2    Title Patient will improve FOTO score by at least 20 points in order to indicate improved tolerance to activity.    Time 6    Period Weeks    Status New    Target Date 12/12/20      PT LONG TERM GOAL #3   Title Patient will be able to complete 5x STS in under 11.4 seconds in order to reduce the risk of falls.    Time 6    Period Weeks    Status New    Target Date 12/12/20      PT LONG TERM GOAL #4   Title Patient will improve ROM for R knee extension/flexion to 0-115 degrees to improve squatting, and other functional mobility.    Time 6    Period Weeks    Status New    Target Date 12/12/20      PT LONG TERM GOAL #5   Title Patient will be able to ambulate at least 400 feet in 2MWT in order to demonstrate improved gait speed for community ambulation.    Time 6    Period Weeks    Status New    Target Date 12/12/20                  Plan - 10/31/20 1527    Clinical Impression Statement Patient is a 70 y.o. female who presents to physical therapy s/p R TKA. She presents with pain limited deficits in R knee strength, ROM, endurance, gait, balance, and functional mobility with ADL. She is having to modify and restrict ADL as indicated by FOTO score as well as subjective information and objective measures which is affecting overall participation. Patient will benefit from skilled physical therapy in order to improve function and reduce impairment.    Personal Factors and Comorbidities Fitness;Behavior Pattern;Past/Current Experience;Comorbidity 3+;Time since onset of injury/illness/exacerbation    Comorbidities Depression, High Blood Pressure, chronic knee pain    Examination-Activity Limitations Locomotion Level;Transfers;Bend;Squat;Stairs;Stand;Lift;Dressing    Examination-Participation Restrictions Cleaning;Occupation;Volunteer;Shop;Yard Work;Laundry;Meal Prep;Church;Community Activity    Stability/Clinical Decision Making Stable/Uncomplicated    Clinical Decision Making Low     Rehab Potential Good    PT Frequency 2x / week    PT Duration 6 weeks    PT Treatment/Interventions ADLs/Self Care Home Management;Aquatic Therapy;Cryotherapy;Electrical Stimulation;Iontophoresis 4mg /ml Dexamethasone;Moist Heat;Traction;DME Instruction;Gait training;Stair training;Functional mobility training;Therapeutic activities;Therapeutic exercise;Balance training;Neuromuscular re-education;Patient/family education;Orthotic Fit/Training;Manual techniques;Dry needling;Energy conservation;Splinting;Taping;Spinal Manipulations;Joint Manipulations    PT Next Visit Plan continue R knee ROM and LE strengthening, possibly manual for edema    PT Home Exercise Plan 4/18 quad set, SLR, heel slides    Consulted and Agree with Plan of Care Patient           Patient will benefit from skilled therapeutic intervention in order to improve the following deficits and impairments:  Abnormal gait,Difficulty walking,Pain,Decreased endurance,Decreased activity tolerance,Decreased balance,Improper body mechanics,Impaired flexibility,Hypomobility,Decreased mobility,Decreased strength,Increased edema  Visit Diagnosis: Right knee pain, unspecified chronicity  Muscle weakness (generalized)  Other abnormalities of gait and mobility  Other symptoms and signs involving the musculoskeletal system     Problem List Patient Active Problem List  Diagnosis Date Noted  . Gastroesophageal reflux disease without esophagitis 05/01/2018  . Hypertension 03/07/2016  . History of melanoma 12/20/2014  . Hyperlipidemia 12/20/2014    4:01 PM, 10/31/20 Mearl Latin PT, DPT Physical Therapist at [place], [place], [address] Phone: [phone]   Fax:  [phone]  Name: ROZELL KETTLEWELL MRN: [id number] Date of Birth: 09-14-1950

## 2020-11-02 ENCOUNTER — Encounter (HOSPITAL_COMMUNITY): Payer: Medicare Other

## 2020-11-02 ENCOUNTER — Ambulatory Visit (HOSPITAL_COMMUNITY): Payer: Medicare Other | Admitting: Physical Therapy

## 2020-11-04 ENCOUNTER — Encounter (HOSPITAL_COMMUNITY): Payer: Medicare Other | Admitting: Physical Therapy

## 2020-11-04 ENCOUNTER — Ambulatory Visit (HOSPITAL_COMMUNITY): Payer: Medicare Other

## 2020-11-07 ENCOUNTER — Ambulatory Visit (HOSPITAL_COMMUNITY): Payer: Medicare Other | Admitting: Physical Therapy

## 2020-11-09 ENCOUNTER — Ambulatory Visit (HOSPITAL_COMMUNITY): Payer: Medicare Other | Admitting: Physical Therapy

## 2020-11-09 ENCOUNTER — Other Ambulatory Visit: Payer: Self-pay

## 2020-11-09 DIAGNOSIS — R2689 Other abnormalities of gait and mobility: Secondary | ICD-10-CM

## 2020-11-09 DIAGNOSIS — M25561 Pain in right knee: Secondary | ICD-10-CM

## 2020-11-09 DIAGNOSIS — M6281 Muscle weakness (generalized): Secondary | ICD-10-CM

## 2020-11-09 NOTE — Therapy (Signed)
[facility] [place], [place], [address] Phone: [phone]   Fax:  [phone]  Physical Therapy Treatment  Patient Details  Name: Rachel Wood MRN: [id number] Date of Birth: [birth year] Referring Provider (PT): Elsie Saas MD   Encounter Date: 11/09/2020   PT End of Session - 11/09/20 1529    Visit Number 2    Number of Visits 12    Date for PT Re-Evaluation 12/12/20    Authorization Type UHC Medicare (no auth, no VL)    Progress Note Due on Visit 10    PT Start Time 1445    PT Stop Time 1525    PT Time Calculation (min) 40 min    Activity Tolerance Patient tolerated treatment well    Behavior During Therapy [facility] for tasks assessed/performed           Past Medical History:  Diagnosis Date  . Cancer [facility]) 2004   Breast Cancer  . Constipation   . Depression   . Hypercholesteremia   . Hypertension   . Osteoporosis   . Personal history of chemotherapy 2004  . Personal history of radiation therapy 2004    Past Surgical History:  Procedure Laterality Date  . CHOLECYSTECTOMY    . COLONOSCOPY  12/24/2011   Procedure: COLONOSCOPY;  Surgeon: Danie Binder, MD;  [facility];  Service: Endoscopy;  Laterality: N/A;  8:30 AM  . Left breast lumpectomy  2004  . LYMPH GLAND EXCISION    . TRACHEOSTOMY     as a child    There were no vitals filed for this visit.   Subjective Assessment - 11/09/20 1445    Subjective Pt states that her knee is feeling pretty good today she seems to have more pain at night.    Limitations House hold activities;Walking;Standing;Lifting    Patient Stated Goals to get back motion and get back into her yard.    Currently in Pain? Yes    Pain Score 1     Pain Location Knee    Pain Orientation Right    Pain Descriptors / Indicators Burning    Pain Type Surgical pain    Pain Onset In the past 7 days    Pain Frequency Constant    Aggravating Factors  walking and standing     Pain Relieving Factors meds and rest md said not to ice                             [facility] Adult PT Treatment/Exercise - 11/09/20 0001      Exercises   Exercises Knee/Hip      Knee/Hip Exercises: Aerobic   Stationary Bike 5' full revolution to improve range      Knee/Hip Exercises: Standing   Heel Raises Both;10 reps    Knee Flexion Both;10 reps    Terminal Knee Extension Right;10 reps      Knee/Hip Exercises: Seated   Long Arc Quad Right;10 reps    Long Arc Quad Limitations pulling leg back to increased flexion at end      Knee/Hip Exercises: Supine   Quad Sets Right;10 reps    Short Arc Quad Sets Right;10 reps    Heel Slides Right;10 reps    Straight Leg Raises Right;1 set;10 reps    Knee Extension Limitations 5    Knee Flexion Limitations 110      Manual Therapy   Manual Therapy  Edema management    Manual therapy comments seperate from all other aspects of treatment    Edema Management retro massage ankle to hip                    PT Short Term Goals - 11/09/20 1511      PT SHORT TERM GOAL #1   Title Patient will be independent with HEP in order to improve functional outcomes.    Time 3    Period Weeks    Status Achieved    Target Date 11/21/20      PT SHORT TERM GOAL #2   Title Patient will report at least 25% improvement in symptoms for improved quality of life.    Time 3    Period Weeks    Status On-going    Target Date 11/21/20             PT Long Term Goals - 11/09/20 1512      PT LONG TERM GOAL #1   Title Patient will report at least 75% improvement in symptoms for improved quality of life.    Time 6    Period Weeks    Status On-going      PT LONG TERM GOAL #2   Title Patient will improve FOTO score by at least 20 points in order to indicate improved tolerance to activity.    Time 6    Period Weeks    Status On-going      PT LONG TERM GOAL #3   Title Patient will be able to complete 5x STS in under 11.4  seconds in order to reduce the risk of falls.    Time 6    Period Weeks    Status On-going      PT LONG TERM GOAL #4   Title Patient will improve ROM for R knee extension/flexion to 0-115 degrees to improve squatting, and other functional mobility.    Time 6    Period Weeks    Status On-going      PT LONG TERM GOAL #5   Title Patient will be able to ambulate at least 400 feet in 2MWT in order to demonstrate improved gait speed for community ambulation.    Time 6    Period Weeks    Status On-going                 Plan - 11/09/20 1529    Clinical Impression Statement Goals reviewed with pt.  Added ex to HEP.  Added manual to decrease edema.  Pt is improving in gait, strength and ROM and will continue to benefit from skilled PT.    Personal Factors and Comorbidities Fitness;Behavior Pattern;Past/Current Experience;Comorbidity 3+;Time since onset of injury/illness/exacerbation    Comorbidities Depression, High Blood Pressure, chronic knee pain    Examination-Activity Limitations Locomotion Level;Transfers;Bend;Squat;Stairs;Stand;Lift;Dressing    Examination-Participation Restrictions Cleaning;Occupation;Volunteer;Shop;Yard Work;Laundry;Meal Prep;Church;Community Activity    Stability/Clinical Decision Making Stable/Uncomplicated    Rehab Potential Good    PT Frequency 2x / week    PT Duration 6 weeks    PT Treatment/Interventions ADLs/Self Care Home Management;Aquatic Therapy;Cryotherapy;Electrical Stimulation;Iontophoresis 4mg /ml Dexamethasone;Moist Heat;Traction;DME Instruction;Gait training;Stair training;Functional mobility training;Therapeutic activities;Therapeutic exercise;Balance training;Neuromuscular re-education;Patient/family education;Orthotic Fit/Training;Manual techniques;Dry needling;Energy conservation;Splinting;Taping;Spinal Manipulations;Joint Manipulations    PT Next Visit Plan continue R knee ROM and LE strengthening, possibly manual for edema    PT Home  Exercise Plan 4/18 quad set, SLR, heel slides    Consulted and Agree with Plan of Care Patient  Patient will benefit from skilled therapeutic intervention in order to improve the following deficits and impairments:  Abnormal gait,Difficulty walking,Pain,Decreased endurance,Decreased activity tolerance,Decreased balance,Improper body mechanics,Impaired flexibility,Hypomobility,Decreased mobility,Decreased strength,Increased edema  Visit Diagnosis: Right knee pain, unspecified chronicity  Muscle weakness (generalized)  Other abnormalities of gait and mobility     Problem List Patient Active Problem List   Diagnosis Date Noted  . Gastroesophageal reflux disease without esophagitis 05/01/2018  . Hypertension 03/07/2016  . History of melanoma 12/20/2014  . Hyperlipidemia 12/20/2014    Rayetta Humphrey, PT CLT [phone] 11/09/2020, 3:33 PM  [place] [address] [place], [place], [address] Phone: [phone]   Fax:  [phone]  Name: Rachel Wood MRN: [id number] Date of Birth: 08-Nov-1950

## 2020-11-11 ENCOUNTER — Encounter (HOSPITAL_COMMUNITY): Payer: Medicare Other

## 2020-11-14 ENCOUNTER — Encounter (HOSPITAL_COMMUNITY): Payer: Self-pay | Admitting: Physical Therapy

## 2020-11-14 ENCOUNTER — Ambulatory Visit (HOSPITAL_COMMUNITY): Payer: Medicare Other | Attending: Orthopedic Surgery | Admitting: Physical Therapy

## 2020-11-14 ENCOUNTER — Other Ambulatory Visit: Payer: Self-pay

## 2020-11-14 DIAGNOSIS — M25561 Pain in right knee: Secondary | ICD-10-CM | POA: Insufficient documentation

## 2020-11-14 DIAGNOSIS — M6281 Muscle weakness (generalized): Secondary | ICD-10-CM | POA: Insufficient documentation

## 2020-11-14 DIAGNOSIS — R29898 Other symptoms and signs involving the musculoskeletal system: Secondary | ICD-10-CM | POA: Insufficient documentation

## 2020-11-14 DIAGNOSIS — R2689 Other abnormalities of gait and mobility: Secondary | ICD-10-CM | POA: Diagnosis present

## 2020-11-14 NOTE — Therapy (Signed)
[place], [place], [address] Phone: [phone]   Fax:  [phone]  Physical Therapy Treatment  Patient Details  Name: Rachel Wood MRN: [id number] Date of Birth: [birth year] Referring Provider (PT): Elsie Saas MD   Encounter Date: 11/14/2020   PT End of Session - 11/14/20 1317    Visit Number 3    Number of Visits 12    Date for PT Re-Evaluation 12/12/20    Authorization Type UHC Medicare (no auth, no VL)    Progress Note Due on Visit 10    PT Start Time 1317    PT Stop Time 1357    PT Time Calculation (min) 40 min    Activity Tolerance Patient tolerated treatment well    Behavior During Therapy [facility] for tasks assessed/performed           Past Medical History:  Diagnosis Date  . Cancer [facility]) 2004   Breast Cancer  . Constipation   . Depression   . Hypercholesteremia   . Hypertension   . Osteoporosis   . Personal history of chemotherapy 2004  . Personal history of radiation therapy 2004    Past Surgical History:  Procedure Laterality Date  . CHOLECYSTECTOMY    . COLONOSCOPY  12/24/2011   Procedure: COLONOSCOPY;  Surgeon: Danie Binder, MD;  [facility];  Service: Endoscopy;  Laterality: N/A;  8:30 AM  . Left breast lumpectomy  2004  . LYMPH GLAND EXCISION    . TRACHEOSTOMY     as a child    There were no vitals filed for this visit.   Subjective Assessment - 11/14/20 1318    Subjective Patient continues to have some pain at home especially night. Her home exercises are going well. She notes some warmth in the knee. She does exercises a day.    Limitations House hold activities;Walking;Standing;Lifting    Patient Stated Goals to get back motion and get back into her yard.    Currently in Pain? No/denies    Pain Onset In the past 7 days                             [facility] Adult PT Treatment/Exercise - 11/14/20 0001      Knee/Hip Exercises: Stretches    Quad Stretch Right;5 reps;20 seconds    Quad Stretch Limitations prone with strap      Knee/Hip Exercises: Aerobic   Stationary Bike 5' full revolution for dynamic warm up level 3      Knee/Hip Exercises: Standing   Heel Raises Both;20 reps    Knee Flexion Both;20 reps    Hip Abduction Both;20 reps    Forward Step Up Right;2 sets;10 reps;Hand Hold: 1;Step Height: 4"      Knee/Hip Exercises: Seated   Sit to Sand 2 sets;10 reps      Knee/Hip Exercises: Supine   Knee Extension Limitations lacking 3    Knee Flexion Limitations 111                  PT Education - 11/14/20 1318    Education Details HEP, exercise mechanics, elevation    Person(s) Educated Patient    Methods Explanation;Demonstration;Handout    Comprehension Verbalized understanding;Returned demonstration            PT Short Term Goals - 11/09/20 1511      PT SHORT TERM GOAL #1   Title  Patient will be independent with HEP in order to improve functional outcomes.    Time 3    Period Weeks    Status Achieved    Target Date 11/21/20      PT SHORT TERM GOAL #2   Title Patient will report at least 25% improvement in symptoms for improved quality of life.    Time 3    Period Weeks    Status On-going    Target Date 11/21/20             PT Long Term Goals - 11/09/20 1512      PT LONG TERM GOAL #1   Title Patient will report at least 75% improvement in symptoms for improved quality of life.    Time 6    Period Weeks    Status On-going      PT LONG TERM GOAL #2   Title Patient will improve FOTO score by at least 20 points in order to indicate improved tolerance to activity.    Time 6    Period Weeks    Status On-going      PT LONG TERM GOAL #3   Title Patient will be able to complete 5x STS in under 11.4 seconds in order to reduce the risk of falls.    Time 6    Period Weeks    Status On-going      PT LONG TERM GOAL #4   Title Patient will improve ROM for R knee extension/flexion to  0-115 degrees to improve squatting, and other functional mobility.    Time 6    Period Weeks    Status On-going      PT LONG TERM GOAL #5   Title Patient will be able to ambulate at least 400 feet in 2MWT in order to demonstrate improved gait speed for community ambulation.    Time 6    Period Weeks    Status On-going                 Plan - 11/14/20 1317    Clinical Impression Statement Patient educated on signs of infection and following up with MD if she has any symptoms. Patient with improving ROM to lacking 3 to 111 following dynamic warm up on bike. Knee flexion ROM improves to 115 following prone quad stretch. Patient able to perform increased reps of previously performed exercises. She requires cueing for reducing UE support throughout session. Tolerates addition of step up. Educated patient on elevation for reducing edema. Patient educated on using elevation for edema reduction. Patient will continue to benefit from skilled physical therapy in order to reduce impairment and improve function.    Personal Factors and Comorbidities Fitness;Behavior Pattern;Past/Current Experience;Comorbidity 3+;Time since onset of injury/illness/exacerbation    Comorbidities Depression, High Blood Pressure, chronic knee pain    Examination-Activity Limitations Locomotion Level;Transfers;Bend;Squat;Stairs;Stand;Lift;Dressing    Examination-Participation Restrictions Cleaning;Occupation;Volunteer;Shop;Yard Work;Laundry;Meal Prep;Church;Community Activity    Stability/Clinical Decision Making Stable/Uncomplicated    Rehab Potential Good    PT Frequency 2x / week    PT Duration 6 weeks    PT Treatment/Interventions ADLs/Self Care Home Management;Aquatic Therapy;Cryotherapy;Electrical Stimulation;Iontophoresis 4mg /ml Dexamethasone;Moist Heat;Traction;DME Instruction;Gait training;Stair training;Functional mobility training;Therapeutic activities;Therapeutic exercise;Balance training;Neuromuscular  re-education;Patient/family education;Orthotic Fit/Training;Manual techniques;Dry needling;Energy conservation;Splinting;Taping;Spinal Manipulations;Joint Manipulations    PT Next Visit Plan continue R knee ROM and LE strengthening, possibly manual for edema    PT Home Exercise Plan 4/18 quad set, SLR, heel slides 5/2 STS    Consulted and Agree with Plan of Care  Patient           Patient will benefit from skilled therapeutic intervention in order to improve the following deficits and impairments:  Abnormal gait,Difficulty walking,Pain,Decreased endurance,Decreased activity tolerance,Decreased balance,Improper body mechanics,Impaired flexibility,Hypomobility,Decreased mobility,Decreased strength,Increased edema  Visit Diagnosis: Right knee pain, unspecified chronicity  Muscle weakness (generalized)  Other abnormalities of gait and mobility  Other symptoms and signs involving the musculoskeletal system     Problem List Patient Active Problem List   Diagnosis Date Noted  . Gastroesophageal reflux disease without esophagitis 05/01/2018  . Hypertension 03/07/2016  . History of melanoma 12/20/2014  . Hyperlipidemia 12/20/2014    1:58 PM, 11/14/20 Mearl Latin PT, DPT Physical Therapist at [place], [place], [address] Phone: [phone]   Fax:  [phone]  Name: Rachel Wood MRN: [id number] Date of Birth: Aug 01, 1950

## 2020-11-14 NOTE — Patient Instructions (Signed)
Access Code: [email or web address] URL: [email or web address] Date: 11/14/2020 Prepared by: Mitzi Hansen Ronit Cranfield  Exercises Sit to Stand with Arms Crossed - 1 x daily - 7 x weekly - 2 sets - 10 reps

## 2020-11-16 ENCOUNTER — Encounter (HOSPITAL_COMMUNITY): Payer: Self-pay

## 2020-11-16 ENCOUNTER — Other Ambulatory Visit: Payer: Self-pay

## 2020-11-16 ENCOUNTER — Ambulatory Visit (HOSPITAL_COMMUNITY): Payer: Medicare Other

## 2020-11-16 DIAGNOSIS — M25561 Pain in right knee: Secondary | ICD-10-CM

## 2020-11-16 DIAGNOSIS — R29898 Other symptoms and signs involving the musculoskeletal system: Secondary | ICD-10-CM

## 2020-11-16 DIAGNOSIS — R2689 Other abnormalities of gait and mobility: Secondary | ICD-10-CM

## 2020-11-16 DIAGNOSIS — M6281 Muscle weakness (generalized): Secondary | ICD-10-CM

## 2020-11-16 NOTE — Therapy (Signed)
[place], [place], [address] Phone: [phone]   Fax:  [phone]  Physical Therapy Treatment  Patient Details  Name: Rachel Wood MRN: [id number] Date of Birth: Nov 19, 1950 Referring Provider (PT): Elsie Saas MD   Encounter Date: 11/16/2020   PT End of Session - 11/16/20 1450    Visit Number 4    Number of Visits 12    Date for PT Re-Evaluation 12/12/20    Authorization Type UHC Medicare (no auth, no VL)    Progress Note Due on Visit 10    PT Start Time 1447    PT Stop Time 1525    PT Time Calculation (min) 38 min    Activity Tolerance Patient tolerated treatment well    Behavior During Therapy [facility] for tasks assessed/performed           Past Medical History:  Diagnosis Date  . Cancer [facility]) 2004   Breast Cancer  . Constipation   . Depression   . Hypercholesteremia   . Hypertension   . Osteoporosis   . Personal history of chemotherapy 2004  . Personal history of radiation therapy 2004    Past Surgical History:  Procedure Laterality Date  . CHOLECYSTECTOMY    . COLONOSCOPY  12/24/2011   Procedure: COLONOSCOPY;  Surgeon: Danie Binder, MD;  [facility];  Service: Endoscopy;  Laterality: N/A;  8:30 AM  . Left breast lumpectomy  2004  . LYMPH GLAND EXCISION    . TRACHEOSTOMY     as a child    There were no vitals filed for this visit.   Subjective Assessment - 11/16/20 1449    Subjective Pt stated she is feeling pretty good.  Reports main pain at night, making difficult to sleep at night.    Patient Stated Goals to get back motion and get back into her yard.    Currently in Pain? No/denies    Pain Descriptors / Indicators Tightness                             [facility] Adult PT Treatment/Exercise - 11/16/20 0001      Knee/Hip Exercises: Stretches   Quad Stretch Right;3 reps;30 seconds    Quad Stretch Limitations prone with strap    Knee: Self-Stretch to  increase Flexion 3 reps;10 seconds    Knee: Self-Stretch Limitations knee drive on 24MP step for flexion      Knee/Hip Exercises: Aerobic   Stationary Bike 5' full revolution for dynamic warm up level 3 seat 10      Knee/Hip Exercises: Standing   Heel Raises Both;15 reps;5 seconds    Heel Raises Limitations incline slope, toe raises    Terminal Knee Extension Right;10 reps    Theraband Level (Terminal Knee Extension) Level 3 (Green)    Terminal Knee Extension Limitations 5" holds    Lateral Step Up Right;10 reps;Hand Hold: 2;Step Height: 4"    Forward Step Up 15 reps;Hand Hold: 1;Step Height: 4"    Functional Squat 10 reps    Functional Squat Limitations front of chair, cueing for mechanics    SLS Rt 5" Lt 8" max    Other Standing Knee Exercises sideste; 2RT GTB around thigh      Knee/Hip Exercises: Seated   Sit to Sand 10 reps;without UE support      Knee/Hip Exercises: Supine   Knee Extension AROM    Knee  Extension Limitations lacking 3    Knee Flexion AROM    Knee Flexion Limitations 117                    PT Short Term Goals - 11/09/20 1511      PT SHORT TERM GOAL #1   Title Patient will be independent with HEP in order to improve functional outcomes.    Time 3    Period Weeks    Status Achieved    Target Date 11/21/20      PT SHORT TERM GOAL #2   Title Patient will report at least 25% improvement in symptoms for improved quality of life.    Time 3    Period Weeks    Status On-going    Target Date 11/21/20             PT Long Term Goals - 11/09/20 1512      PT LONG TERM GOAL #1   Title Patient will report at least 75% improvement in symptoms for improved quality of life.    Time 6    Period Weeks    Status On-going      PT LONG TERM GOAL #2   Title Patient will improve FOTO score by at least 20 points in order to indicate improved tolerance to activity.    Time 6    Period Weeks    Status On-going      PT LONG TERM GOAL #3   Title  Patient will be able to complete 5x STS in under 11.4 seconds in order to reduce the risk of falls.    Time 6    Period Weeks    Status On-going      PT LONG TERM GOAL #4   Title Patient will improve ROM for R knee extension/flexion to 0-115 degrees to improve squatting, and other functional mobility.    Time 6    Period Weeks    Status On-going      PT LONG TERM GOAL #5   Title Patient will be able to ambulate at least 400 feet in 2MWT in order to demonstrate improved gait speed for community ambulation.    Time 6    Period Weeks    Status On-going                 Plan - 11/16/20 1528    Clinical Impression Statement Progressed functional strengthening with additional squats, lateral step up and balance training.  ROM progressing well with AROM 3-117 degrees.  Added TKE and sidestep with GTB around thigh for end range extension and hip strengthening to HEP with printout given.  No reports of pain through session.    Personal Factors and Comorbidities Fitness;Behavior Pattern;Past/Current Experience;Comorbidity 3+;Time since onset of injury/illness/exacerbation    Comorbidities Depression, High Blood Pressure, chronic knee pain    Examination-Activity Limitations Locomotion Level;Transfers;Bend;Squat;Stairs;Stand;Lift;Dressing    Examination-Participation Restrictions Cleaning;Occupation;Volunteer;Shop;Yard Work;Laundry;Meal Prep;Church;Community Activity    Stability/Clinical Decision Making Stable/Uncomplicated    Clinical Decision Making Low    Rehab Potential Good    PT Frequency 2x / week    PT Duration 6 weeks    PT Treatment/Interventions ADLs/Self Care Home Management;Aquatic Therapy;Cryotherapy;Electrical Stimulation;Iontophoresis 4mg /ml Dexamethasone;Moist Heat;Traction;DME Instruction;Gait training;Stair training;Functional mobility training;Therapeutic activities;Therapeutic exercise;Balance training;Neuromuscular re-education;Patient/family education;Orthotic  Fit/Training;Manual techniques;Dry needling;Energy conservation;Splinting;Taping;Spinal Manipulations;Joint Manipulations    PT Next Visit Plan Increased step up height, add vector stance.  Continue Rt knee ROM and LE strengthening, possible manual for edema.  PT Home Exercise Plan 4/18 quad set, SLR, heel slides 5/2 STS; 11/16/20: TKE and sidestep with GTB.    Consulted and Agree with Plan of Care Patient           Patient will benefit from skilled therapeutic intervention in order to improve the following deficits and impairments:  Abnormal gait,Difficulty walking,Pain,Decreased endurance,Decreased activity tolerance,Decreased balance,Improper body mechanics,Impaired flexibility,Hypomobility,Decreased mobility,Decreased strength,Increased edema  Visit Diagnosis: Muscle weakness (generalized)  Right knee pain, unspecified chronicity  Other abnormalities of gait and mobility  Other symptoms and signs involving the musculoskeletal system     Problem List Patient Active Problem List   Diagnosis Date Noted  . Gastroesophageal reflux disease without esophagitis 05/01/2018  . Hypertension 03/07/2016  . History of melanoma 12/20/2014  . Hyperlipidemia 12/20/2014   Ihor Austin, LPTA/CLT; CBIS [phone]  Aldona Lento 11/16/2020, 3:32 PM  [place] [address] [place], [place], [address] Phone: [phone]   Fax:  [phone]  Name: SARABELLE GENSON MRN: [id number] Date of Birth: 08-20-1950

## 2020-11-18 ENCOUNTER — Encounter (HOSPITAL_COMMUNITY): Payer: Medicare Other

## 2020-11-21 ENCOUNTER — Encounter (HOSPITAL_COMMUNITY): Payer: Self-pay | Admitting: Physical Therapy

## 2020-11-21 ENCOUNTER — Other Ambulatory Visit: Payer: Self-pay

## 2020-11-21 ENCOUNTER — Ambulatory Visit (HOSPITAL_COMMUNITY): Payer: Medicare Other | Admitting: Physical Therapy

## 2020-11-21 DIAGNOSIS — R29898 Other symptoms and signs involving the musculoskeletal system: Secondary | ICD-10-CM

## 2020-11-21 DIAGNOSIS — R2689 Other abnormalities of gait and mobility: Secondary | ICD-10-CM

## 2020-11-21 DIAGNOSIS — M6281 Muscle weakness (generalized): Secondary | ICD-10-CM

## 2020-11-21 DIAGNOSIS — M25561 Pain in right knee: Secondary | ICD-10-CM | POA: Diagnosis not present

## 2020-11-21 IMAGING — MR MR LUMBAR SPINE W/O CM
4 of 5 series · 27 of 48 positions shown · non-contrast
Comparison: None.

CLINICAL DATA: Spondylolisthesis, right lower back pain

EXAM:
MRI LUMBAR SPINE WITHOUT CONTRAST
TECHNIQUE: Multiplanar, multisequence MR imaging of the lumbar spine was
performed. No intravenous contrast was administered.

[Series 2: T2 · sagittal · 4.0mm · 1.09mm/px · 6 of 16 slices shown (1 of 2)]
[im 1/16]
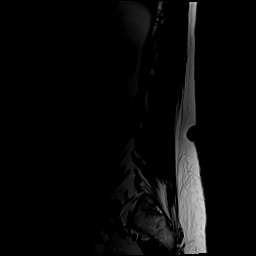
[im 4/16]
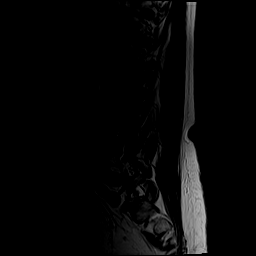
[im 7/16]
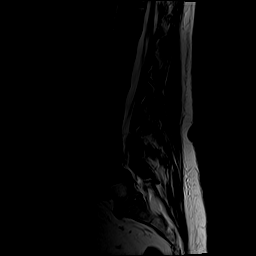
[im 10/16]
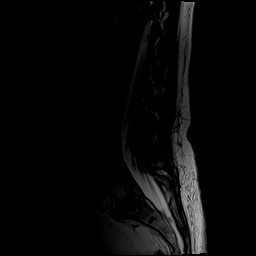
[im 13/16]
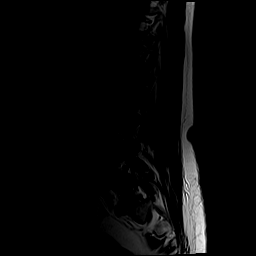
[im 16/16]
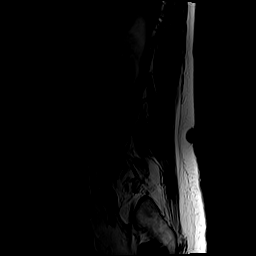

[Series 4: T1 · sagittal · 4.0mm · 1.09mm/px · 6 of 16 slices shown (1 of 2)]
[im 1/16]
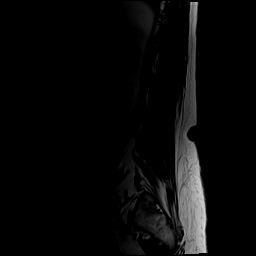
[im 4/16]
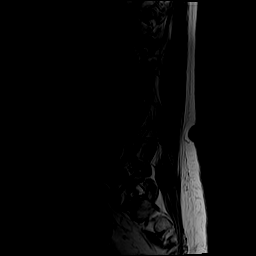
[im 7/16]
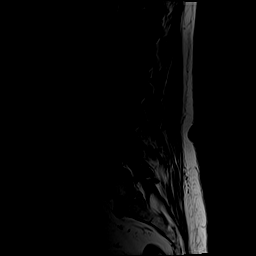
[im 10/16]
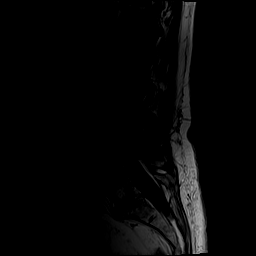
[im 13/16]
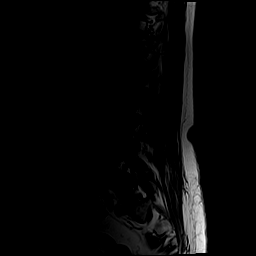
[im 16/16]
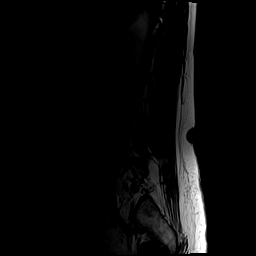

[Series 5: T2 · axial · 4.0mm · 0.39mm/px · z∈[-60,+156]mm · 9 of 39 slices shown (2 of 2)]
[im 1/39]
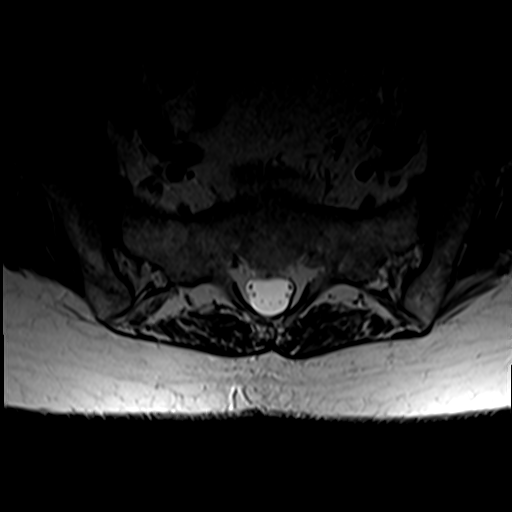
[im 6/39]
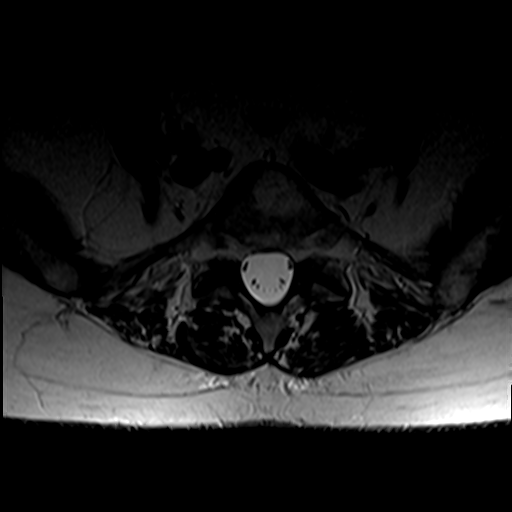
[im 11/39]
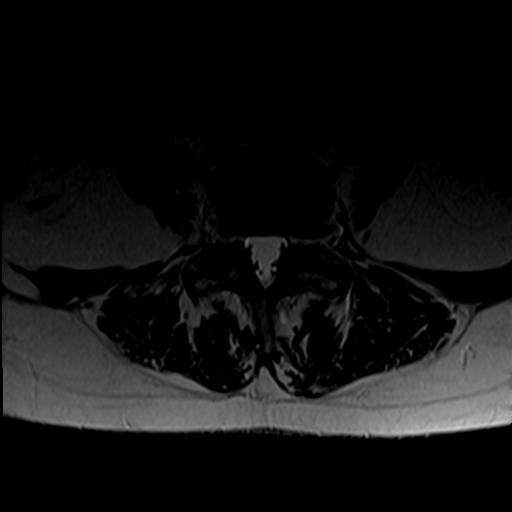
[im 17/39]
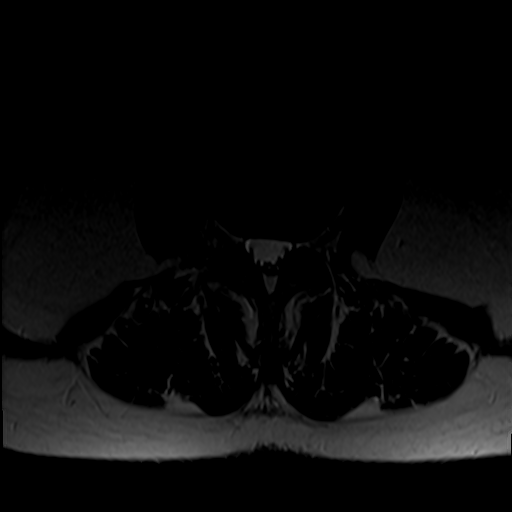
[im 20/39]
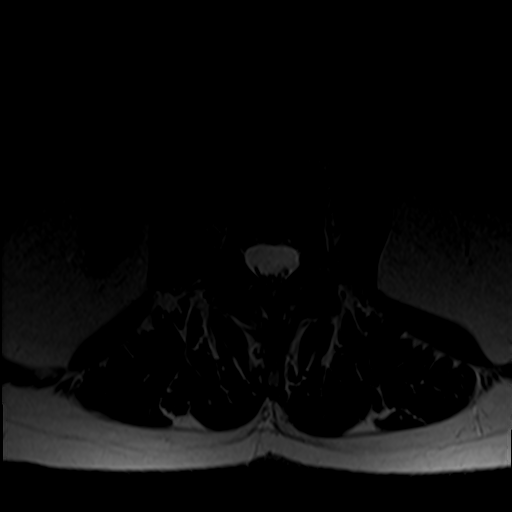
[im 22/39]
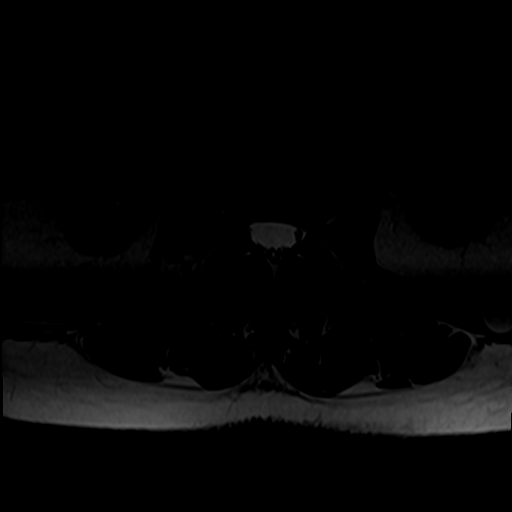
[im 28/39]
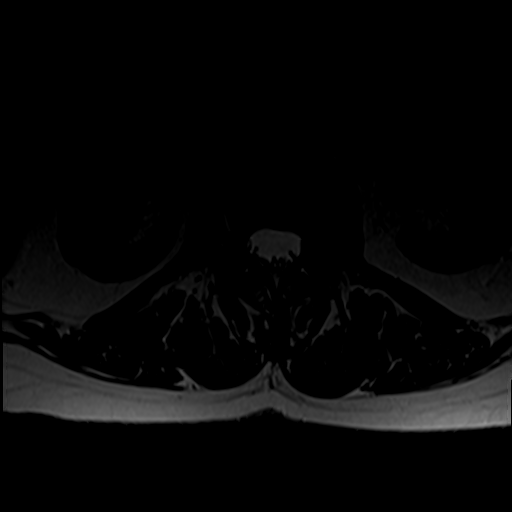
[im 33/39]
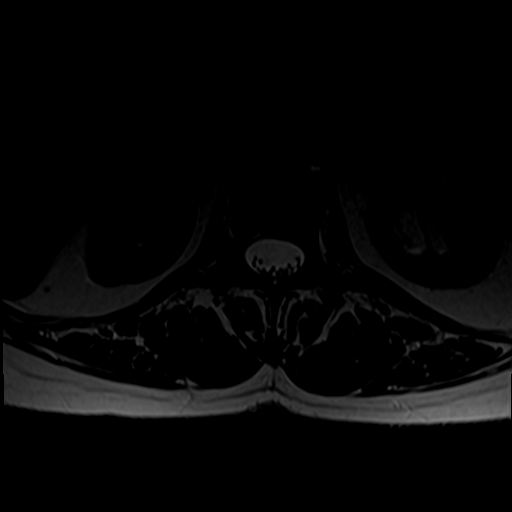
[im 39/39]
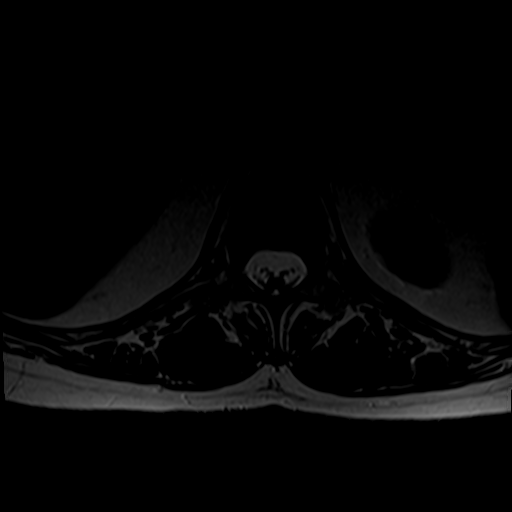

[Series 6: T1 · axial · 4.0mm · 0.39mm/px · z∈[-60,+127]mm · 6 of 39 slices shown (2 of 2)]
[im 1/39]
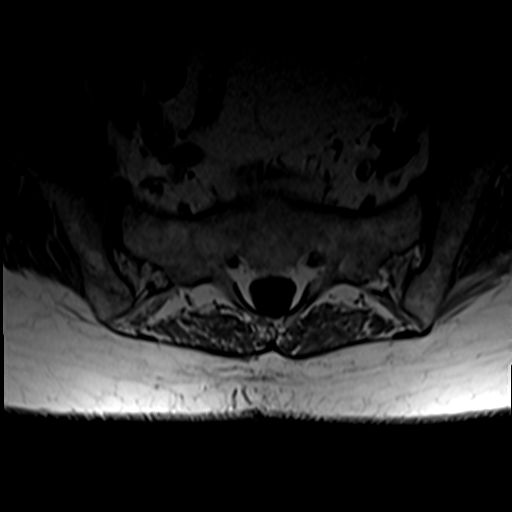
[im 6/39]
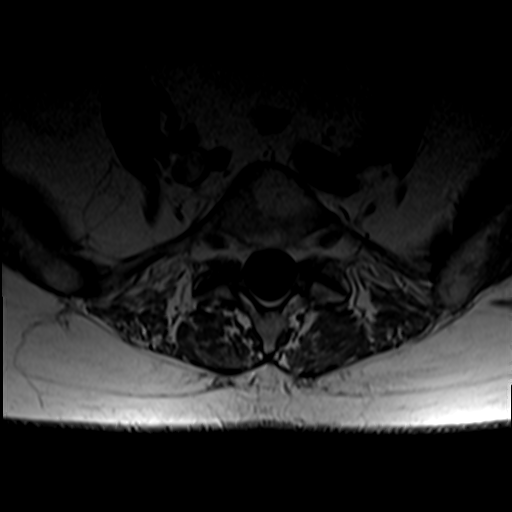
[im 11/39]
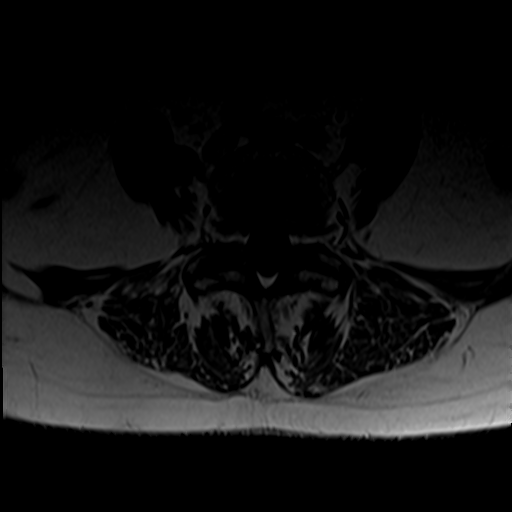
[im 17/39]
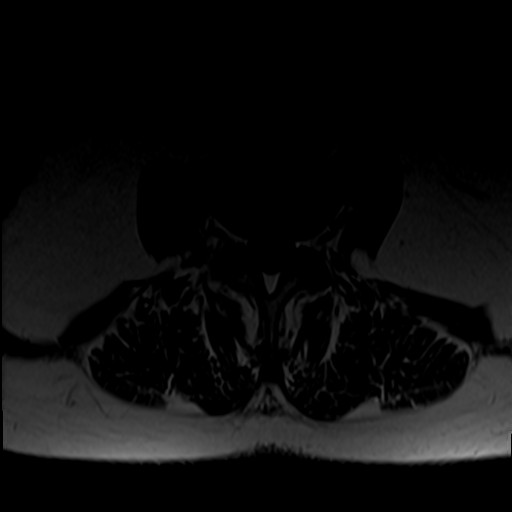
[im 20/39]
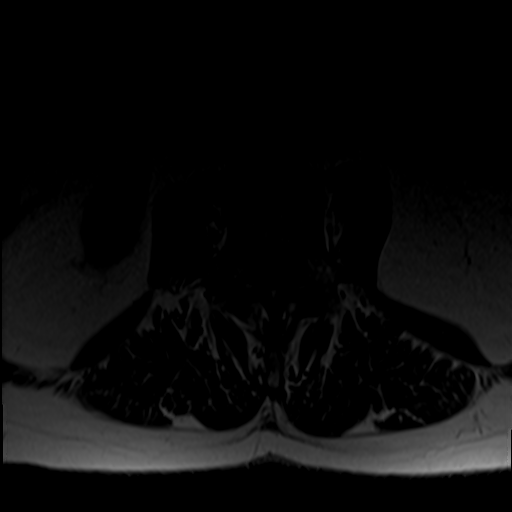
[im 33/39]
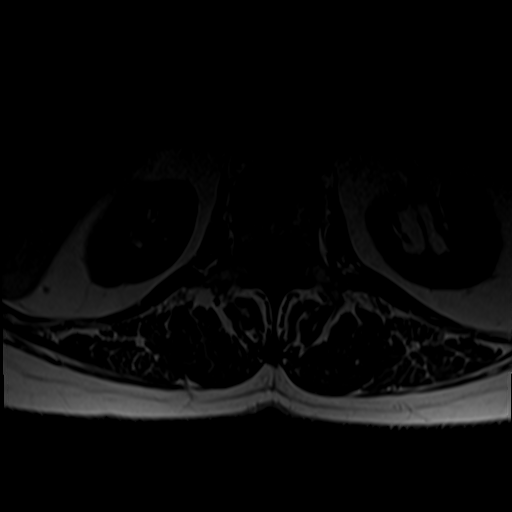

[27 of 48 positions shown; findings below may reference images not displayed]

FINDINGS: Segmentation: There are 5 non-rib bearing lumbar type vertebral
bodies with the last intervertebral disc space labeled as L5-S1.

Alignment:  Normal

Vertebrae: There is disc height loss and desiccation with fragmented
anterior osteophytes seen at L5-S1. There is a T1/T2 bright osseous
lesion within the L5 vertebral body, likely intraosseous hemangioma.

Conus medullaris and cauda equina: Conus extends to the L1 level.
Conus and cauda equina appear normal.

Paraspinal and other soft tissues: The paraspinal soft tissues and
visualized retroperitoneal structures are unremarkable. The
sacroiliac joints are intact.

Disc levels:

T12-L1:  No significant canal or neural foraminal narrowing.

L1-L2:   No significant canal or neural foraminal narrowing.

L2-L3: There is a minimal broad-based disc bulge, however no
significant canal or neural foraminal narrowing.

L3-L4: There is a broad-based disc bulge, however no significant
canal or neural foraminal narrowing.

L4-L5: There is a broad-based disc bulge with ligamentum flavum
hypertrophy and facet arthrosis which causes moderate bilateral
neural foraminal narrowing.

L5-S1: There is disc desiccation disc height loss and small disc
osteophyte complex posteriorly. There is facet arthrosis and
moderate bilateral neural foraminal narrowing.
IMPRESSION: Lumbar spine spondylosis most notable at L4-L5 and L5-S1 with
moderate bilateral neural foraminal narrowing. No central canal
stenosis.

## 2020-11-21 NOTE — Patient Instructions (Signed)
Access Code: [email or web address] URL: [email or web address] Date: 11/21/2020 Prepared by: Mitzi Hansen Layli Capshaw  Exercises Step Up - 1 x daily - 7 x weekly - 3 sets - 10 reps

## 2020-11-21 NOTE — Therapy (Signed)
[place], [place], [address] Phone: [phone]   Fax:  [phone]  Physical Therapy Treatment  Patient Details  Name: Rachel Wood MRN: [id number] Date of Birth: Sep 16, 1950 Referring Provider (PT): Elsie Saas MD   Encounter Date: 11/21/2020   PT End of Session - 11/21/20 1317    Visit Number 5    Number of Visits 12    Date for PT Re-Evaluation 12/12/20    Authorization Type UHC Medicare (no auth, no VL)    Progress Note Due on Visit 10    PT Start Time 1317    PT Stop Time 1357    PT Time Calculation (min) 40 min    Activity Tolerance Patient tolerated treatment well    Behavior During Therapy [facility] for tasks assessed/performed           Past Medical History:  Diagnosis Date  . Cancer [facility]) 2004   Breast Cancer  . Constipation   . Depression   . Hypercholesteremia   . Hypertension   . Osteoporosis   . Personal history of chemotherapy 2004  . Personal history of radiation therapy 2004    Past Surgical History:  Procedure Laterality Date  . CHOLECYSTECTOMY    . COLONOSCOPY  12/24/2011   Procedure: COLONOSCOPY;  Surgeon: Danie Binder, MD;  [facility];  Service: Endoscopy;  Laterality: N/A;  8:30 AM  . Left breast lumpectomy  2004  . LYMPH GLAND EXCISION    . TRACHEOSTOMY     as a child    There were no vitals filed for this visit.   Subjective Assessment - 11/21/20 1317    Subjective Patient states her knee has been feeling good. It's swelling a lot when up for an hour or 2 at a time. Her home exercises are going alright.    Patient Stated Goals to get back motion and get back into her yard.    Currently in Pain? No/denies                             [place] Adult PT Treatment/Exercise - 11/21/20 0001      Knee/Hip Exercises: Stretches   Sports administrator 5 reps;20 seconds;Right      Knee/Hip Exercises: Aerobic   Stationary Bike 5' full revolution for  dynamic warm up level 3 seat 9      Knee/Hip Exercises: Standing   Heel Raises Both;20 reps;5 seconds    Heel Raises Limitations incline slope, toe raises    Terminal Knee Extension Right;10 reps    Terminal Knee Extension Limitations 10 second holds, blue thick band    Lateral Step Up Right;10 reps;Hand Hold: 2;Step Height: 4"    Lateral Step Up Limitations eccentric control    Forward Step Up Right;2 sets;10 reps;Hand Hold: 2   7 inch step   SLS 2x 30 seconds bilateral with unilateral finger support      Knee/Hip Exercises: Seated   Sit to Sand 2 sets;10 reps      Knee/Hip Exercises: Supine   Knee Extension AROM    Knee Extension Limitations lacking 2    Knee Flexion AROM    Knee Flexion Limitations 117                  PT Education - 11/21/20 1317    Education Details HEP, exercise mechanics    Person(s) Educated Patient  Methods Explanation;Demonstration    Comprehension Verbalized understanding            PT Short Term Goals - 11/09/20 1511      PT SHORT TERM GOAL #1   Title Patient will be independent with HEP in order to improve functional outcomes.    Time 3    Period Weeks    Status Achieved    Target Date 11/21/20      PT SHORT TERM GOAL #2   Title Patient will report at least 25% improvement in symptoms for improved quality of life.    Time 3    Period Weeks    Status On-going    Target Date 11/21/20             PT Long Term Goals - 11/09/20 1512      PT LONG TERM GOAL #1   Title Patient will report at least 75% improvement in symptoms for improved quality of life.    Time 6    Period Weeks    Status On-going      PT LONG TERM GOAL #2   Title Patient will improve FOTO score by at least 20 points in order to indicate improved tolerance to activity.    Time 6    Period Weeks    Status On-going      PT LONG TERM GOAL #3   Title Patient will be able to complete 5x STS in under 11.4 seconds in order to reduce the risk of falls.     Time 6    Period Weeks    Status On-going      PT LONG TERM GOAL #4   Title Patient will improve ROM for R knee extension/flexion to 0-115 degrees to improve squatting, and other functional mobility.    Time 6    Period Weeks    Status On-going      PT LONG TERM GOAL #5   Title Patient will be able to ambulate at least 400 feet in 2MWT in order to demonstrate improved gait speed for community ambulation.    Time 6    Period Weeks    Status On-going                 Plan - 11/21/20 1317    Clinical Impression Statement Patient able to complete full revolutions on bike with seat closer for dynamic warm up. Patient showing from lacking 2 to 117 which improves to 120 following prone quad stretch. Patient able to complete step up from increased step height showing improving quad strength. Demonstrates improving eccentric strength and motor control with lateral step down. Patient will continue to benefit from skilled physical therapy in order to reduce impairment and improve function.    Personal Factors and Comorbidities Fitness;Behavior Pattern;Past/Current Experience;Comorbidity 3+;Time since onset of injury/illness/exacerbation    Comorbidities Depression, High Blood Pressure, chronic knee pain    Examination-Activity Limitations Locomotion Level;Transfers;Bend;Squat;Stairs;Stand;Lift;Dressing    Examination-Participation Restrictions Cleaning;Occupation;Volunteer;Shop;Yard Work;Laundry;Meal Prep;Church;Community Activity    Stability/Clinical Decision Making Stable/Uncomplicated    Rehab Potential Good    PT Frequency 2x / week    PT Duration 6 weeks    PT Treatment/Interventions ADLs/Self Care Home Management;Aquatic Therapy;Cryotherapy;Electrical Stimulation;Iontophoresis 4mg /ml Dexamethasone;Moist Heat;Traction;DME Instruction;Gait training;Stair training;Functional mobility training;Therapeutic activities;Therapeutic exercise;Balance training;Neuromuscular  re-education;Patient/family education;Orthotic Fit/Training;Manual techniques;Dry needling;Energy conservation;Splinting;Taping;Spinal Manipulations;Joint Manipulations    PT Next Visit Plan Increased step up height, add vector stance.  Continue Rt knee ROM and LE strengthening, possible manual for edema.  PT Home Exercise Plan 4/18 quad set, SLR, heel slides 5/2 STS; 11/16/20: TKE and sidestep with GTB. 5/9 step up    Consulted and Agree with Plan of Care Patient           Patient will benefit from skilled therapeutic intervention in order to improve the following deficits and impairments:  Abnormal gait,Difficulty walking,Pain,Decreased endurance,Decreased activity tolerance,Decreased balance,Improper body mechanics,Impaired flexibility,Hypomobility,Decreased mobility,Decreased strength,Increased edema  Visit Diagnosis: Muscle weakness (generalized)  Right knee pain, unspecified chronicity  Other abnormalities of gait and mobility  Other symptoms and signs involving the musculoskeletal system     Problem List Patient Active Problem List   Diagnosis Date Noted  . Gastroesophageal reflux disease without esophagitis 05/01/2018  . Hypertension 03/07/2016  . History of melanoma 12/20/2014  . Hyperlipidemia 12/20/2014    1:57 PM, 11/21/20 Mearl Latin PT, DPT Physical Therapist at [place], [place], [address] Phone: [phone]   Fax:  [phone]  Name: Rachel Wood MRN: [id number] Date of Birth: 1951-07-05

## 2020-11-23 ENCOUNTER — Encounter (HOSPITAL_COMMUNITY): Payer: Self-pay

## 2020-11-23 ENCOUNTER — Other Ambulatory Visit: Payer: Self-pay

## 2020-11-23 ENCOUNTER — Ambulatory Visit (HOSPITAL_COMMUNITY): Payer: Medicare Other

## 2020-11-23 DIAGNOSIS — M6281 Muscle weakness (generalized): Secondary | ICD-10-CM

## 2020-11-23 DIAGNOSIS — M25561 Pain in right knee: Secondary | ICD-10-CM

## 2020-11-23 DIAGNOSIS — R29898 Other symptoms and signs involving the musculoskeletal system: Secondary | ICD-10-CM

## 2020-11-23 DIAGNOSIS — R2689 Other abnormalities of gait and mobility: Secondary | ICD-10-CM

## 2020-11-23 NOTE — Patient Instructions (Addendum)
Single Leg Balance: Eyes Open    Stand on right leg with eyes open. Hold 30 seconds. 5 reps 2 times per day.  http://ggbe.exer.us/5   Copyright  VHI. All rights reserved.   HIP: Flexion / KNEE: Extension, Heel Strike - Standing        Standing tall with hand assistance bring leg forward and hold for 5", without touching down bring leg out to side and hold 5" then extend hip hold for 5".  Tandem Stance    Right foot in front of left, heel touching toe both feet "straight ahead". Stand on Foot Triangle of Support with both feet. Balance in this position 30 seconds. Do with left foot in front of right.  Copyright  VHI. All rights reserved.

## 2020-11-23 NOTE — Therapy (Signed)
[place], [place], [address] Phone: [phone]   Fax:  [phone]  Physical Therapy Treatment  Patient Details  Name: Rachel Wood MRN: [id number] Date of Birth: 13-Oct-1950 Referring Provider (PT): Elsie Saas MD   Encounter Date: 11/23/2020   PT End of Session - 11/23/20 1402    Visit Number 6    Number of Visits 12    Date for PT Re-Evaluation 12/12/20    Authorization Type UHC Medicare (no auth, no VL)    Progress Note Due on Visit 10    PT Start Time 1357    PT Stop Time 1439    PT Time Calculation (min) 42 min    Activity Tolerance Patient tolerated treatment well    Behavior During Therapy [facility] for tasks assessed/performed           Past Medical History:  Diagnosis Date  . Cancer [facility]) 2004   Breast Cancer  . Constipation   . Depression   . Hypercholesteremia   . Hypertension   . Osteoporosis   . Personal history of chemotherapy 2004  . Personal history of radiation therapy 2004    Past Surgical History:  Procedure Laterality Date  . CHOLECYSTECTOMY    . COLONOSCOPY  12/24/2011   Procedure: COLONOSCOPY;  Surgeon: Danie Binder, MD;  [facility];  Service: Endoscopy;  Laterality: N/A;  8:30 AM  . Left breast lumpectomy  2004  . LYMPH GLAND EXCISION    . TRACHEOSTOMY     as a child    There were no vitals filed for this visit.   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  Reports she was able to go up and down stairs with reciprocal pattern and reports increased ease, did require handrail.  Stated balance is most difficult.    Limitations House hold activities;Walking;Standing;Lifting    Patient Stated Goals to get back motion and get back into her yard.    Currently in Pain? Yes    Pain Score 4     Pain Location Knee    Pain Orientation Right    Pain Descriptors / Indicators Sore;Tightness     Pain Type Surgical pain    Pain Onset In the past 7 days    Pain Frequency Intermittent    Aggravating Factors  walking and standing    Pain Relieving Factors meds and rest MD said not to ice    Effect of Pain on Daily Activities limits              [facility] PT Assessment - 11/23/20 0001      Assessment   Medical Diagnosis s/p R TKA    Referring Provider (PT) Elsie Saas MD    Onset Date/Surgical Date 10/28/20    Next MD Visit 12/05/20    Prior Therapy none      Precautions   Precautions None                         [facility] Adult PT Treatment/Exercise - 11/23/20 0001      Knee/Hip Exercises: Aerobic   Stationary Bike 5' full revolution for dynamic warm up level 3 seat 9      Knee/Hip Exercises: Machines for Strengthening   Other Machine TKE; Retro gait (toe to heel with emphasis on TKE retro then forward heel to  toe); sidestep both directions 5RT 1 Pl      Knee/Hip Exercises: Standing   Terminal Knee Extension Right;15 reps    Terminal Knee Extension Limitations 2Pl with BodyCraft, 10" holds and slow release    Stairs 7in stairs Initially step to with Bil HR; then reciprocal 2HR then 1 HR 8RT total    SLS Lt 8", Rt 10"    SLS with Vectors 3x 5" BLE wiht intermittent HHA    Other Standing Knee Exercises tandem stance 2x 30" solid surface then on foam                    PT Short Term Goals - 11/09/20 1511      PT SHORT TERM GOAL #1   Title Patient will be independent with HEP in order to improve functional outcomes.    Time 3    Period Weeks    Status Achieved    Target Date 11/21/20      PT SHORT TERM GOAL #2   Title Patient will report at least 25% improvement in symptoms for improved quality of life.    Time 3    Period Weeks    Status On-going    Target Date 11/21/20             PT Long Term Goals - 11/09/20 1512      PT LONG TERM GOAL #1   Title Patient will report at least 75% improvement in symptoms for improved quality  of life.    Time 6    Period Weeks    Status On-going      PT LONG TERM GOAL #2   Title Patient will improve FOTO score by at least 20 points in order to indicate improved tolerance to activity.    Time 6    Period Weeks    Status On-going      PT LONG TERM GOAL #3   Title Patient will be able to complete 5x STS in under 11.4 seconds in order to reduce the risk of falls.    Time 6    Period Weeks    Status On-going      PT LONG TERM GOAL #4   Title Patient will improve ROM for R knee extension/flexion to 0-115 degrees to improve squatting, and other functional mobility.    Time 6    Period Weeks    Status On-going      PT LONG TERM GOAL #5   Title Patient will be able to ambulate at least 400 feet in 2MWT in order to demonstrate improved gait speed for community ambulation.    Time 6    Period Weeks    Status On-going                 Plan - 11/23/20 1637    Clinical Impression Statement Added balance associated activities and continued with functional strengthening.  Pt tolerated well to session with reports of pain resolved following full revolution on bike.  Stairs complete reciprocal pattern, quad weakness noted with hopping and decreased eccentric control, improved with reps and cueing for mechanics.  Added bodycraft for hip strengthening with some challenge wiht SLS.  Added vector stance and tandem to HEP, encouraged pt to complete by sink/counter for safety, verbalized understanding.    Personal Factors and Comorbidities Fitness;Behavior Pattern;Past/Current Experience;Comorbidity 3+;Time since onset of injury/illness/exacerbation    Comorbidities Depression, High Blood Pressure, chronic knee pain    Examination-Activity Limitations Locomotion Level;Transfers;Bend;Squat;Stairs;Stand;Lift;Dressing  Examination-Participation Restrictions Cleaning;Occupation;Volunteer;Shop;Yard Work;Laundry;Meal Prep;Church;Community Activity    Stability/Clinical Decision Making  Stable/Uncomplicated    Clinical Decision Making Low    Rehab Potential Good    PT Frequency 2x / week    PT Duration 6 weeks    PT Treatment/Interventions ADLs/Self Care Home Management;Aquatic Therapy;Cryotherapy;Electrical Stimulation;Iontophoresis 4mg /ml Dexamethasone;Moist Heat;Traction;DME Instruction;Gait training;Stair training;Functional mobility training;Therapeutic activities;Therapeutic exercise;Balance training;Neuromuscular re-education;Patient/family education;Orthotic Fit/Training;Manual techniques;Dry needling;Energy conservation;Splinting;Taping;Spinal Manipulations;Joint Manipulations    PT Next Visit Plan Continue functional strengthening, TKE based activities and balance training.  Manual for edema PRN.    PT Home Exercise Plan 4/18 quad set, SLR, heel slides 5/2 STS; 11/16/20: TKE and sidestep with GTB. 5/9 step up; 5/11: vector stance and tandem stance.    Consulted and Agree with Plan of Care Patient           Patient will benefit from skilled therapeutic intervention in order to improve the following deficits and impairments:  Abnormal gait,Difficulty walking,Pain,Decreased endurance,Decreased activity tolerance,Decreased balance,Improper body mechanics,Impaired flexibility,Hypomobility,Decreased mobility,Decreased strength,Increased edema  Visit Diagnosis: Right knee pain, unspecified chronicity  Other abnormalities of gait and mobility  Other symptoms and signs involving the musculoskeletal system  Muscle weakness (generalized)     Problem List Patient Active Problem List   Diagnosis Date Noted  . Gastroesophageal reflux disease without esophagitis 05/01/2018  . Hypertension 03/07/2016  . History of melanoma 12/20/2014  . Hyperlipidemia 12/20/2014   Ihor Austin, LPTA/CLT; CBIS [id number]  Aldona Lento 11/23/2020, 4:46 PM  [place] [address] [place], [place], [address] Phone: [phone]    Fax:  [phone]  Name: Rachel Wood MRN: [id number] Date of Birth: 1950-09-10

## 2020-11-25 ENCOUNTER — Encounter (HOSPITAL_COMMUNITY): Payer: Medicare Other

## 2020-11-28 ENCOUNTER — Other Ambulatory Visit: Payer: Self-pay

## 2020-11-28 ENCOUNTER — Encounter (HOSPITAL_COMMUNITY): Payer: Self-pay | Admitting: Physical Therapy

## 2020-11-28 ENCOUNTER — Ambulatory Visit (HOSPITAL_COMMUNITY): Payer: Medicare Other | Admitting: Physical Therapy

## 2020-11-28 DIAGNOSIS — M25561 Pain in right knee: Secondary | ICD-10-CM | POA: Diagnosis not present

## 2020-11-28 DIAGNOSIS — M6281 Muscle weakness (generalized): Secondary | ICD-10-CM

## 2020-11-28 DIAGNOSIS — R29898 Other symptoms and signs involving the musculoskeletal system: Secondary | ICD-10-CM

## 2020-11-28 DIAGNOSIS — R2689 Other abnormalities of gait and mobility: Secondary | ICD-10-CM

## 2020-11-28 NOTE — Therapy (Signed)
[place], [place], [address] Phone: [phone]   Fax:  [phone]  Physical Therapy Treatment  Patient Details  Name: Rachel Wood MRN: [id number] Date of Birth: 17-Feb-1951 Referring Provider (PT): Elsie Saas MD   Encounter Date: 11/28/2020   PT End of Session - 11/28/20 1315    Visit Number 7    Number of Visits 12    Date for PT Re-Evaluation 12/12/20    Authorization Type UHC Medicare (no auth, no VL)    Progress Note Due on Visit 10    PT Start Time 1315    PT Stop Time 1355    PT Time Calculation (min) 40 min    Activity Tolerance Patient tolerated treatment well    Behavior During Therapy [facility] for tasks assessed/performed           Past Medical History:  Diagnosis Date  . Cancer [facility]) 2004   Breast Cancer  . Constipation   . Depression   . Hypercholesteremia   . Hypertension   . Osteoporosis   . Personal history of chemotherapy 2004  . Personal history of radiation therapy 2004    Past Surgical History:  Procedure Laterality Date  . CHOLECYSTECTOMY    . COLONOSCOPY  12/24/2011   Procedure: COLONOSCOPY;  Surgeon: Danie Binder, MD;  [facility];  Service: Endoscopy;  Laterality: N/A;  8:30 AM  . Left breast lumpectomy  2004  . LYMPH GLAND EXCISION    . TRACHEOSTOMY     as a child    There were no vitals filed for this visit.   Subjective Assessment - 11/28/20 1316    Subjective Patient states her knee is a little sore today. Her exercises are going well. She has been doing stairs with step too pattern on basement stairs. Patient states 80-90% improvment since beginning PT with greatest limitation with stairs.    Limitations House hold activities;Walking;Standing;Lifting    Patient Stated Goals to get back motion and get back into her yard.    Currently in Pain? Yes    Pain Score 3     Pain Location Knee    Pain Orientation Right    Pain Descriptors / Indicators  Sore    Pain Type Surgical pain    Pain Onset In the past 7 days    Pain Frequency Intermittent                             [facility] Adult PT Treatment/Exercise - 11/28/20 0001      Knee/Hip Exercises: Machines for Strengthening   Cybex Leg Press 3 plates 3x 10      Knee/Hip Exercises: Standing   Terminal Knee Extension Right;1 set;10 reps    Terminal Knee Extension Limitations 10 second holds with thick blue band    Lateral Step Up Right;10 reps;Hand Hold: 2;Step Height: 6";2 sets    Lateral Step Up Limitations eccentric control    Functional Squat 10 reps    Functional Squat Limitations 2 sets on slant board    Stairs 7in stairs Initially step to with Bil HR; then reciprocal 2HR then 1 HR 5RT total    SLS 2x 30 seconds bilateral with intermittent HHA    SLS with Vectors 5x5 second holds bilateral      Knee/Hip Exercises: Seated   Sit to Sand 2 sets;10 reps   2nd set with blut foam under  LLE                 PT Education - 11/28/20 1316    Education Details HEP, exercise mechanics    Person(s) Educated Patient    Methods Explanation;Demonstration    Comprehension Verbalized understanding;Returned demonstration            PT Short Term Goals - 11/28/20 1322      PT SHORT TERM GOAL #1   Title Patient will be independent with HEP in order to improve functional outcomes.    Time 3    Period Weeks    Status Achieved    Target Date 11/21/20      PT SHORT TERM GOAL #2   Title Patient will report at least 25% improvement in symptoms for improved quality of life.    Time 3    Period Weeks    Status Achieved    Target Date 11/21/20             PT Long Term Goals - 11/28/20 1345      PT LONG TERM GOAL #1   Title Patient will report at least 75% improvement in symptoms for improved quality of life.    Time 6    Period Weeks    Status Achieved      PT LONG TERM GOAL #2   Title Patient will improve FOTO score by at least 20 points in order  to indicate improved tolerance to activity.    Time 6    Period Weeks    Status On-going      PT LONG TERM GOAL #3   Title Patient will be able to complete 5x STS in under 11.4 seconds in order to reduce the risk of falls.    Time 6    Period Weeks    Status On-going      PT LONG TERM GOAL #4   Title Patient will improve ROM for R knee extension/flexion to 0-115 degrees to improve squatting, and other functional mobility.    Time 6    Period Weeks    Status On-going      PT LONG TERM GOAL #5   Title Patient will be able to ambulate at least 400 feet in 2MWT in order to demonstrate improved gait speed for community ambulation.    Time 6    Period Weeks    Status On-going                 Plan - 11/28/20 1316    Clinical Impression Statement Patient continues to lack eccentric strength and motor control with stairs but is able to navigate with reciprocal pattern. She shows improving strength with lateral step down on larger step but she continues to show strength deficit. Patient requires verbal and manual cueing for proper mechanics for squatting on slant board for quad strengthening. Patient requires bilateral finger support for SLS with vectors secondary to impaired balance. Patient will continue to benefit from skilled physical therapy in order to reduce impairment and improve function.    Personal Factors and Comorbidities Fitness;Behavior Pattern;Past/Current Experience;Comorbidity 3+;Time since onset of injury/illness/exacerbation    Comorbidities Depression, High Blood Pressure, chronic knee pain    Examination-Activity Limitations Locomotion Level;Transfers;Bend;Squat;Stairs;Stand;Lift;Dressing    Examination-Participation Restrictions Cleaning;Occupation;Volunteer;Shop;Yard Work;Laundry;Meal Prep;Church;Community Activity    Stability/Clinical Decision Making Stable/Uncomplicated    Rehab Potential Good    PT Frequency 2x / week    PT Duration 6 weeks    PT  Treatment/Interventions ADLs/Self Care  Home Management;Aquatic Therapy;Cryotherapy;Electrical Stimulation;Iontophoresis 4mg /ml Dexamethasone;Moist Heat;Traction;DME Instruction;Gait training;Stair training;Functional mobility training;Therapeutic activities;Therapeutic exercise;Balance training;Neuromuscular re-education;Patient/family education;Orthotic Fit/Training;Manual techniques;Dry needling;Energy conservation;Splinting;Taping;Spinal Manipulations;Joint Manipulations    PT Next Visit Plan Continue functional strengthening, TKE based activities and balance training.  Manual for edema PRN.    PT Home Exercise Plan 4/18 quad set, SLR, heel slides 5/2 STS; 11/16/20: TKE and sidestep with GTB. 5/9 step up; 5/11: vector stance and tandem stance.    Consulted and Agree with Plan of Care Patient           Patient will benefit from skilled therapeutic intervention in order to improve the following deficits and impairments:  Abnormal gait,Difficulty walking,Pain,Decreased endurance,Decreased activity tolerance,Decreased balance,Improper body mechanics,Impaired flexibility,Hypomobility,Decreased mobility,Decreased strength,Increased edema  Visit Diagnosis: Right knee pain, unspecified chronicity  Other abnormalities of gait and mobility  Other symptoms and signs involving the musculoskeletal system  Muscle weakness (generalized)     Problem List Patient Active Problem List   Diagnosis Date Noted  . Gastroesophageal reflux disease without esophagitis 05/01/2018  . Hypertension 03/07/2016  . History of melanoma 12/20/2014  . Hyperlipidemia 12/20/2014    1:56 PM, 11/28/20 Mearl Latin PT, DPT Physical Therapist at [place], [place], [address] Phone: [phone]   Fax:  [phone]  Name: Rachel Wood MRN: [id number] Date of Birth: 1951/05/23

## 2020-11-30 ENCOUNTER — Ambulatory Visit (HOSPITAL_COMMUNITY): Payer: Medicare Other | Admitting: Physical Therapy

## 2020-12-01 ENCOUNTER — Ambulatory Visit (HOSPITAL_COMMUNITY): Payer: Medicare Other | Admitting: Physical Therapy

## 2020-12-01 ENCOUNTER — Encounter (HOSPITAL_COMMUNITY): Payer: Self-pay | Admitting: Physical Therapy

## 2020-12-01 ENCOUNTER — Other Ambulatory Visit: Payer: Self-pay

## 2020-12-01 DIAGNOSIS — M25561 Pain in right knee: Secondary | ICD-10-CM

## 2020-12-01 DIAGNOSIS — M6281 Muscle weakness (generalized): Secondary | ICD-10-CM

## 2020-12-01 DIAGNOSIS — R2689 Other abnormalities of gait and mobility: Secondary | ICD-10-CM

## 2020-12-01 DIAGNOSIS — R29898 Other symptoms and signs involving the musculoskeletal system: Secondary | ICD-10-CM

## 2020-12-01 NOTE — Therapy (Signed)
[facility] [address] [place], [place], [address] Phone: [phone]   Fax:  [phone]  Pediatric Physical Therapy Treatment  Patient Details  Name: Rachel Wood MRN: [id number] Date of Birth: November 02, 1950 No data recorded  Encounter date: 12/01/2020    PT End of Session - 12/01/20 1323    Visit Number 8    Number of Visits 12    Date for PT Re-Evaluation 12/12/20    Authorization Type UHC Medicare (no auth, no VL)    Progress Note Due on Visit 10    PT Start Time 1315    PT Stop Time 1355    PT Time Calculation (min) 40 min    Activity Tolerance Patient tolerated treatment well    Behavior During Therapy [facility] for tasks assessed/performed           Past Medical History:  Diagnosis Date  . Cancer [facility]) 2004   Breast Cancer  . Constipation   . Depression   . Hypercholesteremia   . Hypertension   . Osteoporosis   . Personal history of chemotherapy 2004  . Personal history of radiation therapy 2004    Past Surgical History:  Procedure Laterality Date  . CHOLECYSTECTOMY    . COLONOSCOPY  12/24/2011   Procedure: COLONOSCOPY;  Surgeon: Danie Binder, MD;  Location[facility];  Service: Endoscopy;  Laterality: N/A;  8:30 AM  . Left breast lumpectomy  2004  . LYMPH GLAND EXCISION    . TRACHEOSTOMY     as a child    There were no vitals filed for this visit.    Subjective Assessment - 12/01/20 1320    Subjective Patient reports that she had the stomach bug yesterday and has had difficulties with BP this morning so she has had a hard time getting into PT and doing her exercises.    Limitations House hold activities;Walking;Standing;Lifting    Patient Stated Goals to get back motion and get back into her yard.    Currently in Pain? No/denies    Pain Score 0-No pain    Pain Location Knee    Pain Orientation Right    Pain Descriptors / Indicators Tightness    Pain Onset In the past 7 days                          [facility] Adult PT Treatment/Exercise - 12/01/20 0001      Ambulation/Gait   Ambulation Distance (Feet) 450 Feet    Gait Pattern Right flexed knee in stance;Left flexed knee in stance      Knee/Hip Exercises: Stretches   Gastroc Stretch Both;3 reps;30 seconds    Soleus Stretch Both;10 seconds    Other Knee/Hip Stretches Heel slides with holds sitting    Other Knee/Hip Stretches Quad set, heel elevated 3# weight above knee.      Knee/Hip Exercises: Aerobic   Stationary Bike 5' full revolution for dynamic warm up      Knee/Hip Exercises: Standing   Other Standing Knee Exercises step stance on bosu 4x30 sec hold B                          Plan - 12/01/20 1406    Clinical Impression Statement Reports increased stiffness initially but no reports of pain before session with increased difficulty with pain at night. Demo increased difficulty with TKE with heel propped during quad sets  with and without weight on knee for increased stretch. Reports good stretch with gastroc stretches, and given as HEP. Cont difficulty with balance and coordination, especially in step stance on R, placing increased weight through LLE on bosu and preference for UE assist on bars vs hovering. Patient will continue to benefit from skilled physical therapy in order to reduce impairment and improve function.    Personal Factors and Comorbidities Fitness;Behavior Pattern;Past/Current Experience;Comorbidity 3+;Time since onset of injury/illness/exacerbation    Comorbidities Depression, High Blood Pressure, chronic knee pain    Examination-Activity Limitations Locomotion Level;Transfers;Bend;Squat;Stairs;Stand;Lift;Dressing    Examination-Participation Restrictions Cleaning;Occupation;Volunteer;Shop;Yard Work;Laundry;Meal Prep;Church;Community Activity    Stability/Clinical Decision Making Stable/Uncomplicated    Rehab Potential Good    PT Frequency 2x / week    PT Duration 6  weeks    PT Treatment/Interventions ADLs/Self Care Home Management;Aquatic Therapy;Cryotherapy;Electrical Stimulation;Iontophoresis 4mg /ml Dexamethasone;Moist Heat;Traction;DME Instruction;Gait training;Stair training;Functional mobility training;Therapeutic activities;Therapeutic exercise;Balance training;Neuromuscular re-education;Patient/family education;Orthotic Fit/Training;Manual techniques;Dry needling;Energy conservation;Splinting;Taping;Spinal Manipulations;Joint Manipulations    PT Next Visit Plan Continue functional strengthening, TKE based activities and balance training.  Manual for edema PRN.    PT Home Exercise Plan 4/18 quad set, SLR, heel slides 5/2 STS; 11/16/20: TKE and sidestep with GTB. 5/9 step up; 5/11: vector stance and tandem stance. 5/19: gastroc stretch standing or on stairs.    Consulted and Agree with Plan of Care Patient           Patient will benefit from skilled therapeutic intervention in order to improve the following deficits and impairments:     Visit Diagnosis: Right knee pain, unspecified chronicity  Other abnormalities of gait and mobility  Other symptoms and signs involving the musculoskeletal system  Muscle weakness (generalized)   Problem List Patient Active Problem List   Diagnosis Date Noted  . Gastroesophageal reflux disease without esophagitis 05/01/2018  . Hypertension 03/07/2016  . History of melanoma 12/20/2014  . Hyperlipidemia 12/20/2014    2:11 PM,12/01/20 Domenic Moras, PT, DPT Physical Therapist at [facility] [place], [place], [address] Phone: [phone]   Fax:  [phone]  Name: MAKENZY KRIST MRN: [id number] Date of Birth: June 27, 1951

## 2020-12-02 ENCOUNTER — Encounter (HOSPITAL_COMMUNITY): Payer: Medicare Other

## 2020-12-05 ENCOUNTER — Ambulatory Visit (HOSPITAL_COMMUNITY): Payer: Medicare Other | Admitting: Physical Therapy

## 2020-12-07 ENCOUNTER — Ambulatory Visit (HOSPITAL_COMMUNITY): Payer: Medicare Other | Admitting: Physical Therapy

## 2020-12-07 ENCOUNTER — Other Ambulatory Visit: Payer: Self-pay

## 2020-12-07 ENCOUNTER — Encounter (HOSPITAL_COMMUNITY): Payer: Self-pay | Admitting: Physical Therapy

## 2020-12-07 DIAGNOSIS — R2689 Other abnormalities of gait and mobility: Secondary | ICD-10-CM

## 2020-12-07 DIAGNOSIS — M25561 Pain in right knee: Secondary | ICD-10-CM | POA: Diagnosis not present

## 2020-12-07 DIAGNOSIS — R29898 Other symptoms and signs involving the musculoskeletal system: Secondary | ICD-10-CM

## 2020-12-07 DIAGNOSIS — M6281 Muscle weakness (generalized): Secondary | ICD-10-CM

## 2020-12-07 NOTE — Therapy (Signed)
[facility] [address] [place], [place], [address] Phone: [phone]   Fax:  [phone]  Physical Therapy Treatment/Discharge Summary  Patient Details  Name: Rachel Wood MRN: [id number] Date of Birth: 15-Dec-1950 Referring Provider (PT): Elsie Saas MD   Encounter Date: 12/07/2020  PHYSICAL THERAPY DISCHARGE SUMMARY  Visits from Start of Care: 9  Current functional level related to goals / functional outcomes: See below   Remaining deficits: See below   Education / Equipment: See below  Plan: Patient agrees to discharge.  Patient goals were met. Patient is being discharged due to meeting the stated rehab goals.  ?????        PT End of Session - 12/07/20 1316    Visit Number 9    Number of Visits 12    Date for PT Re-Evaluation 12/12/20    Authorization Type UHC Medicare (no auth, no VL)    Progress Note Due on Visit 10    PT Start Time 1315    PT Stop Time 1344    PT Time Calculation (min) 29 min    Activity Tolerance Patient tolerated treatment well    Behavior During Therapy WFL for tasks assessed/performed           Past Medical History:  Diagnosis Date  . Cancer [facility]) 2004   Breast Cancer  . Constipation   . Depression   . Hypercholesteremia   . Hypertension   . Osteoporosis   . Personal history of chemotherapy 2004  . Personal history of radiation therapy 2004    Past Surgical History:  Procedure Laterality Date  . CHOLECYSTECTOMY    . COLONOSCOPY  12/24/2011   Procedure: COLONOSCOPY;  Surgeon: Danie Binder, MD;  Location[facility];  Service: Endoscopy;  Laterality: N/A;  8:30 AM  . Left breast lumpectomy  2004  . LYMPH GLAND EXCISION    . TRACHEOSTOMY     as a child    There were no vitals filed for this visit.   Subjective Assessment - 12/07/20 1317    Subjective Patient has been having issues with blood pressure and they are going to give her a lower dose. They're also going  to have her test her blood sugar. All of her home exercises are going well. She will f/u with MD in about 6 weeks. Patient states 100% improvement with PT intervention. Patient states difficulty with stationary bike last time she tried at home but hasn't tried recently.    Limitations House hold activities;Walking;Standing;Lifting    Patient Stated Goals to get back motion and get back into her yard.    Currently in Pain? No/denies              [facility] PT Assessment - 12/07/20 0001      Assessment   Medical Diagnosis s/p R TKA    Referring Provider (PT) Elsie Saas MD    Onset Date/Surgical Date 10/28/20    Next MD Visit 6 weeks    Prior Therapy none      Precautions   Precautions None      Restrictions   Weight Bearing Restrictions No      Balance Screen   Has the patient fallen in the past 6 months No    Has the patient had a decrease in activity level because of a fear of falling?  No    Is the patient reluctant to leave their home because of a fear of falling?  No  Prior Function   Level of Independence Independent      Cognition   Overall Cognitive Status Within Functional Limits for tasks assessed      Observation/Other Assessments   Observations Ambulates without AD with good gait mechanics, minimally antalgic    Focus on Therapeutic Outcomes (FOTO)  99% function      AROM   Right Knee Extension 0    Right Knee Flexion 118      Strength   Right Hip Flexion 4+/5    Right Knee Flexion 5/5    Right Knee Extension 5/5      Transfers   Five time sit to stand comments  9.16 seconds without UE use      Ambulation/Gait   Ambulation/Gait Yes    Ambulation/Gait Assistance 7: Independent    Ambulation Distance (Feet) 515 Feet    Assistive device None    Gait Comments 2MWT                                 PT Education - 12/07/20 1316    Education Details HEP, exercise mechanics    Person(s) Educated Patient    Methods  Explanation;Demonstration    Comprehension Verbalized understanding;Returned demonstration            PT Short Term Goals - 11/28/20 1322      PT SHORT TERM GOAL #1   Title Patient will be independent with HEP in order to improve functional outcomes.    Time 3    Period Weeks    Status Achieved    Target Date 11/21/20      PT SHORT TERM GOAL #2   Title Patient will report at least 25% improvement in symptoms for improved quality of life.    Time 3    Period Weeks    Status Achieved    Target Date 11/21/20             PT Long Term Goals - 12/07/20 1332      PT LONG TERM GOAL #1   Title Patient will report at least 75% improvement in symptoms for improved quality of life.    Time 6    Period Weeks    Status Achieved      PT LONG TERM GOAL #2   Title Patient will improve FOTO score by at least 20 points in order to indicate improved tolerance to activity.    Time 6    Period Weeks    Status Achieved      PT LONG TERM GOAL #3   Title Patient will be able to complete 5x STS in under 11.4 seconds in order to reduce the risk of falls.    Time 6    Period Weeks    Status Achieved      PT LONG TERM GOAL #4   Title Patient will improve ROM for R knee extension/flexion to 0-115 degrees to improve squatting, and other functional mobility.    Time 6    Period Weeks    Status Achieved      PT LONG TERM GOAL #5   Title Patient will be able to ambulate at least 400 feet in 2MWT in order to demonstrate improved gait speed for community ambulation.    Time 6    Period Weeks    Status Achieved  Plan - 12/07/20 1316    Clinical Impression Statement Patient has met all short and long term goals with ability to complete HEP and improvement in symptoms, strength, gait, balance, functional mobility, and activity tolerance. Patient not currently limited at this time.  She demonstrates R knee ROM from 0-118. Discussed continuing HEP with patient and educated  her on returning to PT if needed. Patient discharged from physical therapy at this time.    Personal Factors and Comorbidities Fitness;Behavior Pattern;Past/Current Experience;Comorbidity 3+;Time since onset of injury/illness/exacerbation    Comorbidities Depression, High Blood Pressure, chronic knee pain    Examination-Activity Limitations Locomotion Level;Transfers;Bend;Squat;Stairs;Stand;Lift;Dressing    Examination-Participation Restrictions Cleaning;Occupation;Volunteer;Shop;Yard Work;Laundry;Meal Prep;Church;Community Activity    Stability/Clinical Decision Making Stable/Uncomplicated    Rehab Potential Good    PT Frequency --    PT Duration --    PT Treatment/Interventions ADLs/Self Care Home Management;Aquatic Therapy;Cryotherapy;Electrical Stimulation;Iontophoresis 4mg /ml Dexamethasone;Moist Heat;Traction;DME Instruction;Gait training;Stair training;Functional mobility training;Therapeutic activities;Therapeutic exercise;Balance training;Neuromuscular re-education;Patient/family education;Orthotic Fit/Training;Manual techniques;Dry needling;Energy conservation;Splinting;Taping;Spinal Manipulations;Joint Manipulations    PT Next Visit Plan n/a    PT Home Exercise Plan 4/18 quad set, SLR, heel slides 5/2 STS; 11/16/20: TKE and sidestep with GTB. 5/9 step up; 5/11: vector stance and tandem stance. 5/19: gastroc stretch standing or on stairs.    Consulted and Agree with Plan of Care Patient           Patient will benefit from skilled therapeutic intervention in order to improve the following deficits and impairments:  Abnormal gait,Difficulty walking,Pain,Decreased endurance,Decreased activity tolerance,Decreased balance,Improper body mechanics,Impaired flexibility,Hypomobility,Decreased mobility,Decreased strength,Increased edema  Visit Diagnosis: Right knee pain, unspecified chronicity  Other abnormalities of gait and mobility  Other symptoms and signs involving the musculoskeletal  system  Muscle weakness (generalized)     Problem List Patient Active Problem List   Diagnosis Date Noted  . Gastroesophageal reflux disease without esophagitis 05/01/2018  . Hypertension 03/07/2016  . History of melanoma 12/20/2014  . Hyperlipidemia 12/20/2014    1:49 PM, 12/07/20 Mearl Latin PT, DPT Physical Therapist at [facility] [place], [place], [address] Phone: [phone]   Fax:  [phone]  Name: Rachel Wood MRN: [id number] Date of Birth: 1951/02/09

## 2020-12-08 ENCOUNTER — Ambulatory Visit (HOSPITAL_COMMUNITY): Payer: Medicare Other | Admitting: Physical Therapy

## 2020-12-09 ENCOUNTER — Encounter (HOSPITAL_COMMUNITY): Payer: Medicare Other

## 2020-12-13 ENCOUNTER — Ambulatory Visit (HOSPITAL_COMMUNITY): Payer: Medicare Other | Admitting: Physical Therapy

## 2020-12-15 ENCOUNTER — Encounter (HOSPITAL_COMMUNITY): Payer: Medicare Other

## 2020-12-16 ENCOUNTER — Encounter (HOSPITAL_COMMUNITY): Payer: Medicare Other

## 2021-07-20 ENCOUNTER — Other Ambulatory Visit (HOSPITAL_COMMUNITY): Payer: Self-pay | Admitting: Nurse Practitioner

## 2021-07-20 DIAGNOSIS — Z1231 Encounter for screening mammogram for malignant neoplasm of breast: Secondary | ICD-10-CM

## 2021-07-31 ENCOUNTER — Telehealth: Payer: Self-pay

## 2021-08-02 ENCOUNTER — Other Ambulatory Visit: Payer: Self-pay

## 2021-08-02 ENCOUNTER — Ambulatory Visit (HOSPITAL_COMMUNITY)
Admission: RE | Admit: 2021-08-02 | Discharge: 2021-08-02 | Disposition: A | Discharge status: Home / Self Care | Payer: Medicare Other | Source: Ambulatory Visit | Attending: Nurse Practitioner | Admitting: Nurse Practitioner

## 2021-08-02 DIAGNOSIS — Z1231 Encounter for screening mammogram for malignant neoplasm of breast: Secondary | ICD-10-CM | POA: Diagnosis present

## 2021-08-17 ENCOUNTER — Ambulatory Visit: Payer: Medicare Other | Admitting: Obstetrics & Gynecology

## 2021-08-17 ENCOUNTER — Encounter: Payer: Self-pay | Admitting: Obstetrics & Gynecology

## 2021-08-17 ENCOUNTER — Other Ambulatory Visit: Payer: Self-pay

## 2021-08-17 VITALS — BP 155/80 | HR 73 | Ht 63.0 in | Wt 159.0 lb

## 2021-08-17 DIAGNOSIS — R87810 Cervical high risk human papillomavirus (HPV) DNA test positive: Secondary | ICD-10-CM

## 2021-10-11 ENCOUNTER — Encounter: Payer: Self-pay | Admitting: Obstetrics & Gynecology

## 2021-11-02 DIAGNOSIS — N1831 Chronic kidney disease, stage 3a: Secondary | ICD-10-CM | POA: Insufficient documentation

## 2021-11-02 DIAGNOSIS — R413 Other amnesia: Secondary | ICD-10-CM | POA: Insufficient documentation

## 2021-11-20 ENCOUNTER — Encounter: Payer: Self-pay | Admitting: *Deleted

## 2021-12-06 NOTE — H&P (Signed)
KNEE ARTHROPLASTY ADMISSION H&P  Patient ID: Rachel Wood MRN: [id number] DOB/AGE: Dec 26, 1950 71 y.o.  Chief Complaint: left knee pain.  Planned Procedure Date: 12/27/21 Medical Clearance by Vicenta Aly, NP     HPI: Rachel Wood is a 71 y.o. female who presents for evaluation of OSTEOARTHRITIS LEFT KNEE. The patient has a history of pain and functional disability in the left knee due to arthritis and has failed non-surgical conservative treatments for greater than 12 weeks to include NSAID's and/or analgesics, corticosteriod injections, and activity modification.  Onset of symptoms was gradual, starting 1 years ago with rapidlly worsening course since that time. The patient noted no past surgery on the left knee.  Patient currently rates pain at 8 out of 10 with activity. Patient has worsening of pain with activity and weight bearing and pain that interferes with activities of daily living.  Patient has evidence of joint space narrowing by imaging studies.  There is no active infection.  Past Medical History:  Diagnosis Date   Cancer ([place]) 2004   Breast Cancer   Constipation    Depression    Hypercholesteremia    Hypertension    Osteoporosis    Personal history of chemotherapy 2004   Personal history of radiation therapy 2004   Past Surgical History:  Procedure Laterality Date   CHOLECYSTECTOMY     COLONOSCOPY  12/24/2011   Procedure: COLONOSCOPY;  Surgeon: Danie Binder, MD;  Location[facility];  Service: Endoscopy;  Laterality: N/A;  8:30 AM   Left breast lumpectomy  2004   LYMPH GLAND EXCISION     TRACHEOSTOMY     as a child   Allergies  Allergen Reactions   Codeine Nausea And Vomiting    Needs pre meds   Prior to Admission medications   Medication Sig Start Date End Date Taking? Authorizing Provider  amLODipine (NORVASC) 5 MG tablet Take 5 mg by mouth daily.    [provider]  fenofibrate 160 MG tablet  12/13/18   [provider]   omeprazole (PRILOSEC) 20 MG capsule Take 20 mg by mouth daily.    [provider]  PARoxetine (PAXIL) 10 MG tablet Take 10 mg by mouth every morning.     [provider]  rosuvastatin (CRESTOR) 5 MG tablet Take 5 mg by mouth 3 (three) times a week. Take Monday, Wednesday and Friday    [provider]  valsartan-hydrochlorothiazide (DIOVAN-HCT) 320-12.5 MG tablet Take 1 tablet by mouth daily.    [provider]   Social History   Socioeconomic History   Marital status: Married    Spouse name: Not on file   Number of children: Not on file   Years of education: Not on file   Highest education level: Not on file  Occupational History   Not on file  Tobacco Use   Smoking status: Never   Smokeless tobacco: Never  Vaping Use   Vaping Use: Never used  Substance and Sexual Activity   Alcohol use: No   Drug use: No   Sexual activity: Yes    Birth control/protection: None  Other Topics Concern   Not on file  Social History Narrative   Not on file   Social Determinants of Health   Financial Resource Strain: Low Risk    Difficulty of Paying Living Expenses: Not hard at all  Food Insecurity: No Food Insecurity   Worried About [place] in the Last Year: Never true  Ran Out of Food in the Last Year: Never true  Transportation Needs: No Transportation Needs   Lack of Transportation (Medical): No   Lack of Transportation (Non-Medical): No  Physical Activity: Insufficiently Active   Days of Exercise per Week: 4 days   Minutes of Exercise per Session: 20 min  Stress: No Stress Concern Present   Feeling of Stress : Not at all  Social Connections: Moderately Integrated   Frequency of Communication with Friends and Family: More than three times a week   Frequency of Social Gatherings with Friends and Family: Three times a week   Attends Religious Services: More than 4 times per year   Active Member of Clubs or Organizations: No   Attends  Archivist Meetings: Never   Marital Status: Married   Family History  Problem Relation Age of Onset   Heart disease Mother    Hypertension Mother    Osteoporosis Mother     ROS: Currently denies lightheadedness, dizziness, Fever, chills, CP, SOB.   No personal history of DVT, PE, MI, or CVA. No loose teeth or dentures All other systems have been reviewed and were otherwise currently negative with the exception of those mentioned in the HPI and as above.  Objective: Vitals: Ht: '5\' 3"'$  Wt: 162.4 lbs Temp: 97.4 BP: 151/70 Pulse: 64 O2 99% on room air.   Physical Exam: General: Alert, NAD.  Mildly antalgic Gait  HEENT: EOMI, Good Neck Extension  Pulm: No increased work of breathing.  Clear B/L A/P w/o crackle or wheeze.  CV: RRR, No m/g/r appreciated  GI: soft, NT, ND Neuro: Neuro without gross focal deficit.  Sensation intact distally Skin: No lesions in the area of chief complaint. Scars from previous tracheotomy as a child. MSK/Surgical Site: left knee w/o redness or effusion.  medial JLT. ROM 0-110.  5/5 strength in extension and flexion.  +EHL/FHL.  NVI.  Stable varus and valgus stress.    Imaging Review Plain radiographs demonstrate severe degenerative joint disease of the left knee.   The overall alignment issignificant varus. The bone quality appears to be adequate for age and reported activity level.  Preoperative templating of the joint replacement has been completed, documented, and submitted to the Operating Room personnel in order to optimize intra-operative equipment management.  Assessment: OSTEOARTHRITIS LEFT KNEE Active Problems:   * No active hospital problems. *   Plan: Plan for Procedure(s): TOTAL KNEE ARTHROPLASTY  The patient history, physical exam, clinical judgement of the provider and imaging are consistent with end stage degenerative joint disease and total joint arthroplasty is deemed medically necessary. The treatment options including  medical management, injection therapy, and arthroplasty were discussed at length. The risks and benefits of Procedure(s): TOTAL KNEE ARTHROPLASTY were presented and reviewed.  The risks of nonoperative treatment, versus surgical intervention including but not limited to continued pain, aseptic loosening, stiffness, dislocation/subluxation, infection, bleeding, nerve injury, blood clots, cardiopulmonary complications, morbidity, mortality, among others were discussed. The patient verbalizes understanding and wishes to proceed with the plan.  Patient is being admitted for inpatient treatment for surgery, pain control, PT, prophylactic antibiotics, VTE prophylaxis, progressive ambulation, ADL's and discharge planning.   Dental prophylaxis discussed and recommended for 2 years postoperatively.  The patient does meet the criteria for TXA which will be used perioperatively.   ASA 81 mg BID will be used postoperatively for DVT prophylaxis in addition to SCDs, and early ambulation. The patient is planning to be discharged home with OPPT in care of  family   Ventura Bruns, Hershal Coria 12/06/2021 9:32 AM

## 2021-12-13 NOTE — Patient Instructions (Addendum)
DUE TO COVID-19 ONLY TWO VISITORS  (aged 70 and older)  IS ALLOWED TO COME WITH YOU AND STAY IN THE WAITING ROOM ONLY DURING PRE OP AND PROCEDURE.   **NO VISITORS ARE ALLOWED IN THE SHORT STAY AREA OR RECOVERY ROOM!!**  You are not required to quarantine Hand Hygiene often Do NOT share personal items Notify your provider if you are in close contact with someone who has COVID or you develop fever 100.4 or greater, new onset of sneezing, cough, sore throat, shortness of breath or body aches.        Your procedure is scheduled on:  December 27, 2021   Report to [facility] Main Entrance    Report to admitting at 06:30 AM   Call this number if you have problems the morning of surgery [phone]   Do not eat food :After Midnight.   After Midnight you may have the following liquids until 05:30 AM DAY OF SURGERY  Clear Liquid Diet Water Black Coffee (sugar ok, NO MILK/CREAM OR CREAMERS)  Tea (sugar ok, NO MILK/CREAM OR CREAMERS) regular and decaf                             Plain Jell-O (NO RED)                                           Fruit ices (not with fruit pulp, NO RED)                                     Popsicles (NO RED)                                                                  Juice: apple, WHITE grape, WHITE cranberry Sports drinks like Gatorade (NO RED) Clear broth(vegetable,chicken,beef)                 The day of surgery:  Drink ONE (1) Pre-Surgery Clear Ensure  at 05:30 AM the morning of surgery. Drink in one sitting. Do not sip.  This drink was given to you during your hospital  pre-op appointment visit. Nothing else to drink after completing the Pre-Surgery Clear Ensure          If you have questions, please contact your surgeon's office.   FOLLOW ANY ADDITIONAL PRE OP INSTRUCTIONS YOU RECEIVED FROM YOUR SURGEON'S OFFICE!!!   Oral Hygiene is also important to reduce your risk of infection.                                    Remember - BRUSH YOUR  TEETH THE MORNING OF SURGERY WITH YOUR REGULAR TOOTHPASTE   Do NOT smoke after Midnight   Take these medicines the morning of surgery with A SIP OF WATER: OMEPRAZOLE, AMLODIPINE, ROSUVASTATIN, PAROXETENE, FENOFIBRATE  You may not have any metal on your body including hair pins, jewelry, and body piercing             Do not wear make-up, lotions, powders, perfumes or deodorant  Do not wear nail polish including gel and S&S, artificial/acrylic nails, or any other type of covering on natural nails including finger and toenails. If you have artificial nails, gel coating, etc. that needs to be removed by a nail salon please have this removed prior to surgery or surgery may need to be canceled/ delayed if the surgeon/ anesthesia feels like they are unable to be safely monitored.   Do not shave  48 hours prior to surgery.               Do not bring valuables to the hospital. [place].   Contacts, dentures or bridgework may not be worn into surgery.    Patients discharged on the day of surgery will not be allowed to drive home.  Someone NEEDS to stay with you for the first 24 hours after anesthesia.  Special Instructions: Bring a copy of your healthcare power of attorney and living will documents the day of surgery if you haven't scanned them before.  Please read over the following fact sheets you were given: IF YOU HAVE QUESTIONS ABOUT YOUR PRE-OP INSTRUCTIONS PLEASE CALL [facility] - Preparing for Surgery Before surgery, you can play an important role.  Because skin is not sterile, your skin needs to be as free of germs as possible.  You can reduce the number of germs on your skin by washing with CHG (chlorahexidine gluconate) soap before surgery.  CHG is an antiseptic cleaner which kills germs and bonds with the skin to continue killing germs even after washing. Please DO NOT use if you have an allergy to CHG or  antibacterial soaps.  If your skin becomes reddened/irritated stop using the CHG and inform your nurse when you arrive at Short Stay. Do not shave (including legs and underarms) for at least 48 hours prior to the first CHG shower.  You may shave your face/neck.  Please follow these instructions carefully:  1.  Shower with CHG Soap the night before surgery and the  morning of surgery.  2.  If you choose to wash your hair, wash your hair first as usual with your normal  shampoo.  3.  After you shampoo, rinse your hair and body thoroughly to remove the shampoo.                             4.  Use CHG as you would any other liquid soap.  You can apply chg directly to the skin and wash.  Gently with a scrungie or clean washcloth.  5.  Apply the CHG Soap to your body ONLY FROM THE NECK DOWN.   Do   not use on face/ open                           Wound or open sores. Avoid contact with eyes, ears mouth and   genitals (private parts).                       Wash face,  Genitals (private parts) with your normal soap.  6.  Wash thoroughly, paying special attention to the area where your    surgery  will be performed.  7.  Thoroughly rinse your body with warm water from the neck down.  8.  DO NOT shower/wash with your normal soap after using and rinsing off the CHG Soap.                9.  Pat yourself dry with a clean towel.            10.  Wear clean pajamas.            11.  Place clean sheets on your bed the night of your first shower and do not  sleep with pets. Day of Surgery : Do not apply any lotions/deodorants the morning of surgery.  Please wear clean clothes to the hospital/surgery center.  FAILURE TO FOLLOW THESE INSTRUCTIONS MAY RESULT IN THE CANCELLATION OF YOUR SURGERY  PATIENT SIGNATURE_________________________________  NURSE SIGNATURE__________________________________  ________________________________________________________________________     Adam Phenix  An  incentive spirometer is a tool that can help keep your lungs clear and active. This tool measures how well you are filling your lungs with each breath. Taking long deep breaths may help reverse or decrease the chance of developing breathing (pulmonary) problems (especially infection) following: A long period of time when you are unable to move or be active. BEFORE THE PROCEDURE  If the spirometer includes an indicator to show your best effort, your nurse or respiratory therapist will set it to a desired goal. If possible, sit up straight or lean slightly forward. Try not to slouch. Hold the incentive spirometer in an upright position. INSTRUCTIONS FOR USE  Sit on the edge of your bed if possible, or sit up as far as you can in bed or on a chair. Hold the incentive spirometer in an upright position. Breathe out normally. Place the mouthpiece in your mouth and seal your lips tightly around it. Breathe in slowly and as deeply as possible, raising the piston or the ball toward the top of the column. Hold your breath for 3-5 seconds or for as long as possible. Allow the piston or ball to fall to the bottom of the column. Remove the mouthpiece from your mouth and breathe out normally. Rest for a few seconds and repeat Steps 1 through 7 at least 10 times every 1-2 hours when you are awake. Take your time and take a few normal breaths between deep breaths. The spirometer may include an indicator to show your best effort. Use the indicator as a goal to work toward during each repetition. After each set of 10 deep breaths, practice coughing to be sure your lungs are clear. If you have an incision (the cut made at the time of surgery), support your incision when coughing by placing a pillow or rolled up towels firmly against it. Once you are able to get out of bed, walk around indoors and cough well. You may stop using the incentive spirometer when instructed by your caregiver.  RISKS AND COMPLICATIONS Take  your time so you do not get dizzy or light-headed. If you are in pain, you may need to take or ask for pain medication before doing incentive spirometry. It is harder to take a deep breath if you are having pain. AFTER USE Rest and breathe slowly and easily. It can be helpful to keep track of a log of your progress. Your caregiver can provide you with a simple table to help  with this. If you are using the spirometer at home, follow these instructions: [place] IF:  You are having difficultly using the spirometer. You have trouble using the spirometer as often as instructed. Your pain medication is not giving enough relief while using the spirometer. You develop fever of 100.5 F (38.1 C) or higher. SEEK IMMEDIATE MEDICAL CARE IF:  You cough up bloody sputum that had not been present before. You develop fever of 102 F (38.9 C) or greater. You develop worsening pain at or near the incision site. MAKE SURE YOU:  Understand these instructions. Will watch your condition. Will get help right away if you are not doing well or get worse. Document Released: 11/12/2006 Document Revised: 09/24/2011 Document Reviewed: 01/13/2007 [facility] Patient Information 2014 [place], [place].   ________________________________________________________________________

## 2021-12-13 NOTE — Progress Notes (Addendum)
COVID Vaccine Completed:  Yes   Date of COVID positive in last 90 days:  No  PCP - Dr. Antony Haste  Cardiologist - N/A  Chest x-ray - N/A EKG - 12-14-21 Epic Stress Test - N/A ECHO - N/A Cardiac Cath - N/A Pacemaker/ICD device last checked: Spinal Cord Stimulator:  Bowel Prep - N/A  Sleep Study - N/A CPAP -   Fasting Blood Sugar - N/A Checks Blood Sugar _____ times a day  Blood Thinner Instructions:N/A Aspirin Instructions: Last Dose:  Activity level:   Can go up a flight of stairs and perform activities of daily living without stopping and without symptoms of chest pain or shortness of breath.  Anesthesia review: N/A  Patient denies shortness of breath, fever, cough and chest pain at PAT appointment  Patient verbalized understanding of instructions that were given to them at the PAT appointment. Patient was also instructed that they will need to review over the PAT instructions again at home before surgery.

## 2021-12-14 ENCOUNTER — Encounter (INDEPENDENT_AMBULATORY_CARE_PROVIDER_SITE_OTHER): Payer: Self-pay

## 2021-12-14 ENCOUNTER — Encounter (HOSPITAL_COMMUNITY)
Admission: RE | Admit: 2021-12-14 | Discharge: 2021-12-14 | Disposition: A | Discharge status: Home / Self Care | Payer: Medicare Other | Source: Ambulatory Visit | Attending: Orthopedic Surgery | Admitting: Orthopedic Surgery

## 2021-12-14 ENCOUNTER — Other Ambulatory Visit: Payer: Self-pay

## 2021-12-14 ENCOUNTER — Encounter (HOSPITAL_COMMUNITY): Payer: Self-pay

## 2021-12-14 VITALS — BP 134/89 | HR 66 | Temp 98.4°F | Resp 20 | Ht 63.0 in | Wt 158.0 lb

## 2021-12-14 DIAGNOSIS — I2510 Atherosclerotic heart disease of native coronary artery without angina pectoris: Secondary | ICD-10-CM | POA: Insufficient documentation

## 2021-12-14 DIAGNOSIS — Z01818 Encounter for other preprocedural examination: Secondary | ICD-10-CM | POA: Diagnosis present

## 2021-12-14 HISTORY — DX: Cerebral infarction, unspecified: I63.9

## 2021-12-14 HISTORY — DX: Gastro-esophageal reflux disease without esophagitis: K21.9

## 2021-12-14 HISTORY — DX: Personal history of urinary calculi: Z87.442

## 2021-12-14 HISTORY — DX: Unspecified osteoarthritis, unspecified site: M19.90

## 2021-12-14 HISTORY — DX: Other specified postprocedural states: Z98.890

## 2021-12-14 LAB — CBC
HCT: 40 % (ref 36.0–46.0)
Hemoglobin: 13.3 g/dL (ref 12.0–15.0)
MCH: 29.1 pg (ref 26.0–34.0)
MCHC: 33.3 g/dL (ref 30.0–36.0)
MCV: 87.5 fL (ref 80.0–100.0)
Platelets: 223 10*3/uL (ref 150–400)
RBC: 4.57 MIL/uL (ref 3.87–5.11)
RDW: 13 % (ref 11.5–15.5)
WBC: 5.1 10*3/uL (ref 4.0–10.5)
nRBC: 0 % (ref 0.0–0.2)

## 2021-12-14 LAB — BASIC METABOLIC PANEL
Anion gap: 7 (ref 5–15)
BUN: 29 mg/dL — ABNORMAL HIGH (ref 8–23)
CO2: 27 mmol/L (ref 22–32)
Calcium: 9.8 mg/dL (ref 8.9–10.3)
Chloride: 109 mmol/L (ref 98–111)
Creatinine, Ser: 1.25 mg/dL — ABNORMAL HIGH (ref 0.44–1.00)
GFR, Estimated: 47 mL/min — ABNORMAL LOW (ref >60)
Glucose, Bld: 102 mg/dL — ABNORMAL HIGH (ref 70–99)
Potassium: 4 mmol/L (ref 3.5–5.1)
Sodium: 143 mmol/L (ref 135–145)

## 2021-12-14 LAB — SURGICAL PCR SCREEN
MRSA, PCR: NEGATIVE
Staphylococcus aureus: NEGATIVE

## 2021-12-26 NOTE — Anesthesia Preprocedure Evaluation (Addendum)
Anesthesia Evaluation  Patient identified by MRN, date of birth, ID band Patient awake    Reviewed: Allergy & Precautions, NPO status , Patient's Chart, lab work & pertinent test results  Airway Mallampati: II  TM Distance: >3 FB Neck ROM: Full    Dental no notable dental hx. (+) Missing, Dental Advisory Given, Teeth Intact,    Pulmonary neg pulmonary ROS,    Pulmonary exam normal breath sounds clear to auscultation       Cardiovascular hypertension, Pt. on medications Normal cardiovascular exam Rhythm:Regular Rate:Normal     Neuro/Psych    GI/Hepatic Neg liver ROS, GERD  ,  Endo/Other  negative endocrine ROS  Renal/GU Lab Results      Component                Value               Date                      CREATININE               1.25 (H)            12/14/2021               K                        4.0                 12/14/2021                    Musculoskeletal  (+) Arthritis , Osteoarthritis,    Abdominal   Peds  Hematology Lab Results      Component                Value               Date                   HGB                      13.3                12/14/2021                HCT                      40.0                12/14/2021                  PLT                      223                 12/14/2021              Anesthesia Other Findings R Breast CA S/p Rads  Reproductive/Obstetrics                           Anesthesia Physical Anesthesia Plan  ASA: 3  Anesthesia Plan: Spinal and Regional   Post-op Pain Management: Regional block* and Minimal or no pain anticipated   Induction:   PONV Risk Score and Plan: 3 and Treatment may vary due to age or medical condition and Ondansetron  Airway Management Planned: Natural Airway and Nasal Cannula  Additional Equipment: None  Intra-op Plan:   Post-operative Plan:   Informed Consent: I have reviewed the patients History and  Physical, chart, labs and discussed the procedure including the risks, benefits and alternatives for the proposed anesthesia with the patient or authorized representative who has indicated his/her understanding and acceptance.     Dental advisory given  Plan Discussed with:   Anesthesia Plan Comments: (L adductor w Spinal)       Anesthesia Quick Evaluation

## 2021-12-27 ENCOUNTER — Ambulatory Visit (HOSPITAL_COMMUNITY): Payer: Medicare Other | Admitting: Anesthesiology

## 2021-12-27 ENCOUNTER — Encounter (HOSPITAL_COMMUNITY)
Admission: RE | Disposition: A | Discharge status: Home / Self Care | Payer: Self-pay | Source: Ambulatory Visit | Attending: Orthopedic Surgery

## 2021-12-27 ENCOUNTER — Other Ambulatory Visit: Payer: Self-pay

## 2021-12-27 ENCOUNTER — Ambulatory Visit (HOSPITAL_BASED_OUTPATIENT_CLINIC_OR_DEPARTMENT_OTHER): Payer: Medicare Other | Admitting: Anesthesiology

## 2021-12-27 ENCOUNTER — Ambulatory Visit (HOSPITAL_COMMUNITY): Payer: Medicare Other

## 2021-12-27 ENCOUNTER — Encounter (HOSPITAL_COMMUNITY): Payer: Self-pay | Admitting: Orthopedic Surgery

## 2021-12-27 ENCOUNTER — Ambulatory Visit (HOSPITAL_COMMUNITY)
Admission: RE | Admit: 2021-12-27 | Discharge: 2021-12-27 | Disposition: A | Discharge status: Home / Self Care | Payer: Medicare Other | Source: Ambulatory Visit | Attending: Orthopedic Surgery | Admitting: Orthopedic Surgery

## 2021-12-27 DIAGNOSIS — K219 Gastro-esophageal reflux disease without esophagitis: Secondary | ICD-10-CM | POA: Insufficient documentation

## 2021-12-27 DIAGNOSIS — M1712 Unilateral primary osteoarthritis, left knee: Secondary | ICD-10-CM

## 2021-12-27 DIAGNOSIS — R7989 Other specified abnormal findings of blood chemistry: Secondary | ICD-10-CM | POA: Insufficient documentation

## 2021-12-27 DIAGNOSIS — Z853 Personal history of malignant neoplasm of breast: Secondary | ICD-10-CM | POA: Insufficient documentation

## 2021-12-27 DIAGNOSIS — I10 Essential (primary) hypertension: Secondary | ICD-10-CM | POA: Insufficient documentation

## 2021-12-27 DIAGNOSIS — N179 Acute kidney failure, unspecified: Secondary | ICD-10-CM

## 2021-12-27 HISTORY — PX: TOTAL KNEE ARTHROPLASTY: SHX125

## 2021-12-27 LAB — BASIC METABOLIC PANEL
Anion gap: 8 (ref 5–15)
BUN: 21 mg/dL (ref 8–23)
CO2: 25 mmol/L (ref 22–32)
Calcium: 9.4 mg/dL (ref 8.9–10.3)
Chloride: 108 mmol/L (ref 98–111)
Creatinine, Ser: 1.12 mg/dL — ABNORMAL HIGH (ref 0.44–1.00)
GFR, Estimated: 53 mL/min — ABNORMAL LOW (ref >60)
Glucose, Bld: 153 mg/dL — ABNORMAL HIGH (ref 70–99)
Potassium: 3.4 mmol/L — ABNORMAL LOW (ref 3.5–5.1)
Sodium: 141 mmol/L (ref 135–145)

## 2021-12-27 SURGERY — ARTHROPLASTY, KNEE, TOTAL
Anesthesia: Regional | Site: Knee | Laterality: Left

## 2021-12-27 MED ORDER — TRAMADOL HCL 50 MG PO TABS
50.0000 mg | ORAL_TABLET | Freq: Four times a day (QID) | ORAL | Status: DC | PRN
  Administered 2021-12-27: 50 mg via ORAL

## 2021-12-27 MED ORDER — ONDANSETRON HCL 4 MG PO TABS
4.0000 mg | ORAL_TABLET | Freq: Four times a day (QID) | ORAL | Status: DC | PRN
  Filled 2021-12-27: qty 1

## 2021-12-27 MED ORDER — 0.9 % SODIUM CHLORIDE (POUR BTL) OPTIME
TOPICAL | Status: DC | PRN
  Administered 2021-12-27: 1000 mL

## 2021-12-27 MED ORDER — POVIDONE-IODINE 7.5 % EX SOLN: Freq: Once | CUTANEOUS | Status: AC

## 2021-12-27 MED ORDER — LACTATED RINGERS IV BOLUS
250.0000 mL | Freq: Once | INTRAVENOUS | Status: AC
  Administered 2021-12-27: 250 mL via INTRAVENOUS

## 2021-12-27 MED ORDER — PHENYLEPHRINE HCL-NACL 20-0.9 MG/250ML-% IV SOLN
INTRAVENOUS | Status: DC | PRN
  Administered 2021-12-27: 50 ug/min via INTRAVENOUS

## 2021-12-27 MED ORDER — ROPIVACAINE HCL 5 MG/ML IJ SOLN
INTRAMUSCULAR | Status: DC | PRN
  Administered 2021-12-27: 30 mL via PERINEURAL

## 2021-12-27 MED ORDER — AMISULPRIDE (ANTIEMETIC) 5 MG/2ML IV SOLN: 10.0000 mg | Freq: Once | INTRAVENOUS | Status: DC | PRN

## 2021-12-27 MED ORDER — ASPIRIN 81 MG PO TBEC: 81.0000 mg | DELAYED_RELEASE_TABLET | Freq: Two times a day (BID) | ORAL | 0 refills | Status: AC

## 2021-12-27 MED ORDER — POVIDONE-IODINE 10 % EX SWAB: 2.0000 "application " | Freq: Once | CUTANEOUS | Status: DC

## 2021-12-27 MED ORDER — ONDANSETRON HCL 4 MG/2ML IJ SOLN: 4.0000 mg | Freq: Once | INTRAMUSCULAR | Status: DC | PRN

## 2021-12-27 MED ORDER — TRANEXAMIC ACID-NACL 1000-0.7 MG/100ML-% IV SOLN
1000.0000 mg | INTRAVENOUS | Status: AC
  Administered 2021-12-27: 1000 mg via INTRAVENOUS
  Filled 2021-12-27: qty 100

## 2021-12-27 MED ORDER — CELECOXIB 200 MG PO CAPS
400.0000 mg | ORAL_CAPSULE | Freq: Once | ORAL | Status: AC
  Administered 2021-12-27: 400 mg via ORAL
  Filled 2021-12-27: qty 2

## 2021-12-27 MED ORDER — WATER FOR IRRIGATION, STERILE IR SOLN
Status: DC | PRN
  Administered 2021-12-27: 2000 mL

## 2021-12-27 MED ORDER — LACTATED RINGERS IV SOLN: INTRAVENOUS | Status: DC

## 2021-12-27 MED ORDER — CEFAZOLIN SODIUM-DEXTROSE 2-4 GM/100ML-% IV SOLN
2.0000 g | INTRAVENOUS | Status: AC
  Administered 2021-12-27: 2 g via INTRAVENOUS
  Filled 2021-12-27: qty 100

## 2021-12-27 MED ORDER — BUPIVACAINE LIPOSOME 1.3 % IJ SUSP
INTRAMUSCULAR | Status: DC | PRN
  Administered 2021-12-27: 20 mL

## 2021-12-27 MED ORDER — SODIUM CHLORIDE 0.9 % IR SOLN
Status: DC | PRN
  Administered 2021-12-27: 3000 mL

## 2021-12-27 MED ORDER — MIDAZOLAM HCL 2 MG/2ML IJ SOLN
INTRAMUSCULAR | Status: AC
  Filled 2021-12-27: qty 2

## 2021-12-27 MED ORDER — ACETAMINOPHEN 500 MG PO TABS
1000.0000 mg | ORAL_TABLET | Freq: Once | ORAL | Status: AC
  Administered 2021-12-27: 1000 mg via ORAL
  Filled 2021-12-27: qty 2

## 2021-12-27 MED ORDER — SODIUM CHLORIDE 0.9% FLUSH
INTRAVENOUS | Status: DC | PRN
  Administered 2021-12-27: 60 mL

## 2021-12-27 MED ORDER — SODIUM CHLORIDE (PF) 0.9 % IJ SOLN
INTRAMUSCULAR | Status: AC
  Filled 2021-12-27: qty 10

## 2021-12-27 MED ORDER — METHOCARBAMOL 500 MG PO TABS: 500.0000 mg | ORAL_TABLET | Freq: Three times a day (TID) | ORAL | 0 refills | Status: AC | PRN

## 2021-12-27 MED ORDER — ONDANSETRON HCL 4 MG/2ML IJ SOLN: 4.0000 mg | Freq: Four times a day (QID) | INTRAMUSCULAR | Status: DC | PRN

## 2021-12-27 MED ORDER — ONDANSETRON HCL 4 MG PO TABS: 4.0000 mg | ORAL_TABLET | Freq: Three times a day (TID) | ORAL | 0 refills | Status: AC | PRN

## 2021-12-27 MED ORDER — CHLORHEXIDINE GLUCONATE 0.12 % MT SOLN
15.0000 mL | Freq: Once | OROMUCOSAL | Status: AC
  Administered 2021-12-27: 15 mL via OROMUCOSAL

## 2021-12-27 MED ORDER — OXYCODONE HCL 5 MG PO TABS: 5.0000 mg | ORAL_TABLET | Freq: Once | ORAL | Status: DC | PRN

## 2021-12-27 MED ORDER — FENTANYL CITRATE PF 50 MCG/ML IJ SOSY
PREFILLED_SYRINGE | INTRAMUSCULAR | Status: AC
  Filled 2021-12-27: qty 1

## 2021-12-27 MED ORDER — METHOCARBAMOL 500 MG PO TABS: 500.0000 mg | ORAL_TABLET | Freq: Four times a day (QID) | ORAL | Status: DC | PRN

## 2021-12-27 MED ORDER — DEXAMETHASONE SODIUM PHOSPHATE 10 MG/ML IJ SOLN
INTRAMUSCULAR | Status: AC
  Filled 2021-12-27: qty 1

## 2021-12-27 MED ORDER — METHOCARBAMOL 500 MG IVPB - SIMPLE MED
500.0000 mg | Freq: Four times a day (QID) | INTRAVENOUS | Status: DC | PRN
  Filled 2021-12-27: qty 50

## 2021-12-27 MED ORDER — BUPIVACAINE LIPOSOME 1.3 % IJ SUSP
INTRAMUSCULAR | Status: AC
  Filled 2021-12-27: qty 20

## 2021-12-27 MED ORDER — ONDANSETRON HCL 4 MG/2ML IJ SOLN
INTRAMUSCULAR | Status: DC | PRN
  Administered 2021-12-27: 4 mg via INTRAVENOUS

## 2021-12-27 MED ORDER — ORAL CARE MOUTH RINSE: 15.0000 mL | Freq: Once | OROMUCOSAL | Status: AC

## 2021-12-27 MED ORDER — FENTANYL CITRATE (PF) 100 MCG/2ML IJ SOLN
INTRAMUSCULAR | Status: DC | PRN
  Administered 2021-12-27 (×2): 50 ug via INTRAVENOUS

## 2021-12-27 MED ORDER — TRAMADOL HCL 50 MG PO TABS: 50.0000 mg | ORAL_TABLET | Freq: Four times a day (QID) | ORAL | 0 refills | Status: AC | PRN

## 2021-12-27 MED ORDER — BUPIVACAINE IN DEXTROSE 0.75-8.25 % IT SOLN
INTRATHECAL | Status: DC | PRN
  Administered 2021-12-27: 15 mg via INTRATHECAL

## 2021-12-27 MED ORDER — ONDANSETRON HCL 4 MG/2ML IJ SOLN
INTRAMUSCULAR | Status: AC
  Filled 2021-12-27: qty 2

## 2021-12-27 MED ORDER — CEFAZOLIN SODIUM-DEXTROSE 2-4 GM/100ML-% IV SOLN: 2.0000 g | Freq: Four times a day (QID) | INTRAVENOUS | Status: DC

## 2021-12-27 MED ORDER — DEXAMETHASONE SODIUM PHOSPHATE 10 MG/ML IJ SOLN
8.0000 mg | Freq: Once | INTRAMUSCULAR | Status: AC
  Administered 2021-12-27: 8 mg via INTRAVENOUS

## 2021-12-27 MED ORDER — MIDAZOLAM HCL 5 MG/5ML IJ SOLN
INTRAMUSCULAR | Status: DC | PRN
  Administered 2021-12-27 (×2): 1 mg via INTRAVENOUS

## 2021-12-27 MED ORDER — ACETAMINOPHEN 10 MG/ML IV SOLN: 1000.0000 mg | Freq: Once | INTRAVENOUS | Status: DC | PRN

## 2021-12-27 MED ORDER — HYDROMORPHONE HCL 1 MG/ML IJ SOLN: 0.2500 mg | INTRAMUSCULAR | Status: DC | PRN

## 2021-12-27 MED ORDER — ACETAMINOPHEN 500 MG PO TABS: 1000.0000 mg | ORAL_TABLET | Freq: Three times a day (TID) | ORAL | 0 refills | Status: AC | PRN

## 2021-12-27 MED ORDER — ACETAMINOPHEN 10 MG/ML IV SOLN
INTRAVENOUS | Status: AC
  Administered 2021-12-27: 1000 mg via INTRAVENOUS
  Filled 2021-12-27: qty 100

## 2021-12-27 MED ORDER — TRAMADOL HCL 50 MG PO TABS
ORAL_TABLET | ORAL | Status: AC
  Filled 2021-12-27: qty 1

## 2021-12-27 MED ORDER — ACETAMINOPHEN 325 MG PO TABS: 325.0000 mg | ORAL_TABLET | Freq: Four times a day (QID) | ORAL | Status: DC | PRN

## 2021-12-27 MED ORDER — HYDROMORPHONE HCL 1 MG/ML IJ SOLN: 0.5000 mg | INTRAMUSCULAR | Status: DC | PRN

## 2021-12-27 MED ORDER — SODIUM CHLORIDE (PF) 0.9 % IJ SOLN
INTRAMUSCULAR | Status: AC
  Filled 2021-12-27: qty 50

## 2021-12-27 MED ORDER — OXYCODONE HCL 5 MG/5ML PO SOLN: 5.0000 mg | Freq: Once | ORAL | Status: DC | PRN

## 2021-12-27 MED ORDER — ACETAMINOPHEN 500 MG PO TABS: 1000.0000 mg | ORAL_TABLET | Freq: Four times a day (QID) | ORAL | Status: DC

## 2021-12-27 MED ORDER — LACTATED RINGERS IV BOLUS
500.0000 mL | Freq: Once | INTRAVENOUS | Status: AC
  Administered 2021-12-27: 500 mL via INTRAVENOUS

## 2021-12-27 MED ORDER — PROPOFOL 500 MG/50ML IV EMUL
INTRAVENOUS | Status: DC | PRN
  Administered 2021-12-27: 75 ug/kg/min via INTRAVENOUS

## 2021-12-27 SURGICAL SUPPLY — 69 items
ADH SKN CLS APL DERMABOND .7 (GAUZE/BANDAGES/DRESSINGS) ×1
APL PRP STRL LF DISP 70% ISPRP (MISCELLANEOUS) ×2
BAG COUNTER SPONGE SURGICOUNT (BAG) ×1 IMPLANT
BAG SPNG CNTER NS LX DISP (BAG) ×1
BLADE SAG 18X100X1.27 (BLADE) ×2 IMPLANT
BLADE SAW SAG 35X64 .89 (BLADE) ×2 IMPLANT
BNDG CMPR 5X3 CHSV STRCH STRL (GAUZE/BANDAGES/DRESSINGS) ×1
BNDG CMPR MED 10X6 ELC LF (GAUZE/BANDAGES/DRESSINGS) ×1
BNDG COHESIVE 3X5 TAN ST LF (GAUZE/BANDAGES/DRESSINGS) ×2 IMPLANT
BNDG ELASTIC 6X10 VLCR STRL LF (GAUZE/BANDAGES/DRESSINGS) ×2 IMPLANT
BOWL SMART MIX CTS (DISPOSABLE) ×2 IMPLANT
BSPLAT TIB 5D D CMNT STM LT (Knees) ×1 IMPLANT
CEMENT BONE R 1X40 (Cement) ×4 IMPLANT
CHLORAPREP W/TINT 26 (MISCELLANEOUS) ×4 IMPLANT
CLSR STERI-STRIP ANTIMIC 1/2X4 (GAUZE/BANDAGES/DRESSINGS) ×1 IMPLANT
COMP FEM CMT PERS SZ5 LT (Joint) ×2 IMPLANT
COMPONENT FEM CMT PERS SZ5 LT (Joint) IMPLANT
COVER SURGICAL LIGHT HANDLE (MISCELLANEOUS) ×2 IMPLANT
CUFF TOURN SGL QUICK 34 (TOURNIQUET CUFF) ×2
CUFF TRNQT CYL 34X4.125X (TOURNIQUET CUFF) ×1 IMPLANT
DERMABOND ADVANCED (GAUZE/BANDAGES/DRESSINGS) ×1
DERMABOND ADVANCED .7 DNX12 (GAUZE/BANDAGES/DRESSINGS) ×1 IMPLANT
DRAPE INCISE IOBAN 85X60 (DRAPES) ×2 IMPLANT
DRAPE SHEET LG 3/4 BI-LAMINATE (DRAPES) ×2 IMPLANT
DRAPE U-SHAPE 47X51 STRL (DRAPES) ×2 IMPLANT
DRESSING AQUACEL AG SP 3.5X10 (GAUZE/BANDAGES/DRESSINGS) ×1 IMPLANT
DRSG AQUACEL AG ADV 3.5X10 (GAUZE/BANDAGES/DRESSINGS) ×1 IMPLANT
DRSG AQUACEL AG SP 3.5X10 (GAUZE/BANDAGES/DRESSINGS) ×2
ELECT REM PT RETURN 15FT ADLT (MISCELLANEOUS) ×2 IMPLANT
GAUZE SPONGE 4X4 12PLY STRL (GAUZE/BANDAGES/DRESSINGS) ×2 IMPLANT
GLOVE BIOGEL M STRL SZ7.5 (GLOVE) ×4 IMPLANT
GLOVE BIOGEL PI IND STRL 6.5 (GLOVE) IMPLANT
GLOVE BIOGEL PI IND STRL 8 (GLOVE) ×1 IMPLANT
GLOVE BIOGEL PI INDICATOR 6.5 (GLOVE) ×3
GLOVE BIOGEL PI INDICATOR 8 (GLOVE) ×1
GLOVE SURG ORTHO 8.0 STRL STRW (GLOVE) ×4 IMPLANT
GOWN STRL REUS W/ TWL XL LVL3 (GOWN DISPOSABLE) ×1 IMPLANT
GOWN STRL REUS W/TWL XL LVL3 (GOWN DISPOSABLE) ×6
HANDPIECE INTERPULSE COAX TIP (DISPOSABLE) ×2
HDLS TROCR DRIL PIN KNEE 75 (PIN) ×8
HOLDER FOLEY CATH W/STRAP (MISCELLANEOUS) ×2 IMPLANT
HOOD PEEL AWAY FLYTE STAYCOOL (MISCELLANEOUS) ×6 IMPLANT
INSERT ARTISURF PERS SZ 4-5 LT (Insert) ×1 IMPLANT
MANIFOLD NEPTUNE II (INSTRUMENTS) ×2 IMPLANT
MARKER SKIN DUAL TIP RULER LAB (MISCELLANEOUS) ×2 IMPLANT
NS IRRIG 1000ML POUR BTL (IV SOLUTION) ×2 IMPLANT
PACK TOTAL KNEE CUSTOM (KITS) ×2 IMPLANT
PIN DRILL HDLS TROCAR 75 4PK (PIN) IMPLANT
PROTECTOR NERVE ULNAR (MISCELLANEOUS) ×2 IMPLANT
SCREW HEADED 33MM KNEE (MISCELLANEOUS) ×2 IMPLANT
SET HNDPC FAN SPRY TIP SCT (DISPOSABLE) ×1 IMPLANT
SLEEVE SURGEON STRL (DRAPES) ×1 IMPLANT
SPIKE FLUID TRANSFER (MISCELLANEOUS) ×2 IMPLANT
STEM POLY PAT PLY 29M KNEE (Knees) ×1 IMPLANT
STEM TIBIA 5 DEG SZ D L KNEE (Knees) IMPLANT
STRIP CLOSURE SKIN 1/2X4 (GAUZE/BANDAGES/DRESSINGS) ×2 IMPLANT
SUT MNCRL AB 3-0 PS2 18 (SUTURE) ×2 IMPLANT
SUT STRATAFIX 0 PDS 27 VIOLET (SUTURE) ×2
SUT STRATAFIX PDO 1 14 VIOLET (SUTURE) ×2
SUT STRATFX PDO 1 14 VIOLET (SUTURE) ×1
SUT VIC AB 2-0 CT2 27 (SUTURE) ×4 IMPLANT
SUTURE STRATFX 0 PDS 27 VIOLET (SUTURE) ×1 IMPLANT
SUTURE STRATFX PDO 1 14 VIOLET (SUTURE) ×1 IMPLANT
SYR 50ML LL SCALE MARK (SYRINGE) ×2 IMPLANT
TIBIA STEM 5 DEG SZ D L KNEE (Knees) ×2 IMPLANT
TRAY FOLEY MTR SLVR 14FR STAT (SET/KITS/TRAYS/PACK) ×1 IMPLANT
TUBE SUCTION HIGH CAP CLEAR NV (SUCTIONS) ×2 IMPLANT
UNDERPAD 30X36 HEAVY ABSORB (UNDERPADS AND DIAPERS) ×2 IMPLANT
WRAP KNEE MAXI GEL POST OP (GAUZE/BANDAGES/DRESSINGS) ×1 IMPLANT

## 2021-12-27 NOTE — Transfer of Care (Signed)
Immediate Anesthesia Transfer of Care Note  Patient: Rachel Wood  Procedure(s) Performed: TOTAL KNEE ARTHROPLASTY (Left: Knee)  Patient Location: PACU  Anesthesia Type:General  Level of Consciousness: awake, alert , oriented and patient cooperative  Airway & Oxygen Therapy: Patient Spontanous Breathing and Patient connected to face mask oxygen  Post-op Assessment: Report given to RN, Post -op Vital signs reviewed and stable and Patient moving all extremities X 4  Post vital signs: Reviewed and stable  Last Vitals:  Vitals Value Taken Time  BP 152/82 12/27/21 1024  Temp    Pulse 62 12/27/21 1026  Resp 21 12/27/21 1026  SpO2 100 % 12/27/21 1026  Vitals shown include unvalidated device data.  Last Pain:  Vitals:   12/27/21 0716  TempSrc: Oral  PainSc:       Patients Stated Pain Goal: 4 (76/15/18 3437)  Complications: No notable events documented.

## 2021-12-27 NOTE — Discharge Instructions (Signed)

## 2021-12-27 NOTE — Anesthesia Postprocedure Evaluation (Signed)
Anesthesia Post Note  Patient: Rachel Wood  Procedure(s) Performed: TOTAL KNEE ARTHROPLASTY (Left: Knee)     Patient location during evaluation: Nursing Unit Anesthesia Type: Regional and Spinal Level of consciousness: oriented and awake and alert Pain management: pain level controlled Vital Signs Assessment: post-procedure vital signs reviewed and stable Respiratory status: spontaneous breathing and respiratory function stable Cardiovascular status: blood pressure returned to baseline and stable Postop Assessment: no headache, no backache, no apparent nausea or vomiting and patient able to bend at knees Anesthetic complications: no   No notable events documented.  Last Vitals:  Vitals:   12/27/21 1115 12/27/21 1130  BP: (!) 141/78 (!) 153/77  Pulse: 64 61  Resp: 17 17  Temp:  (!) 36.4 C  SpO2: 100% 98%    Last Pain:  Vitals:   12/27/21 1115  TempSrc:   PainSc: 3                  Barnet Glasgow

## 2021-12-27 NOTE — Progress Notes (Signed)
Physical Therapy Treatment Patient Details Name: Rachel Wood MRN: [id number] DOB: Jul 19, 1950 Today's Date: 12/27/2021   History of Present Illness Pt is a 71yo female rpseenting s/p L-TKA on 12/27/21 PMH: hx of breast cancer, GERD, HTN, PONV, hx of stroke.    PT Comments    Pt seen for second of two visits to ensure safe same-day discharge from PACU.  Pt tested and has increased sensation in bilateral glutes and is able to activate them. Pt required min guard for transfers and gait with RW. Patient was able to ambulate 75 feet with RW and min guard and cues for safe walker management. Patient educated on safe sequencing for stair mobility and verbalized safe guarding position for people assisting with mobility. Patient instructed in exercises to facilitate ROM and circulation. Patient will benefit from continued skilled PT interventions to address impairments and progress towards PLOF. Patient has met mobility goals at adequate level for discharge home; will continue to follow if pt continues acute stay to progress towards Mod I goals.    Recommendations for follow up therapy are one component of a multi-disciplinary discharge planning process, led by the attending physician.  Recommendations may be updated based on patient status, additional functional criteria and insurance authorization.  Follow Up Recommendations  Follow physician's recommendations for discharge plan and follow up therapies     Assistance Recommended at Discharge Intermittent Supervision/Assistance  Patient can return home with the following A little help with walking and/or transfers;A little help with bathing/dressing/bathroom;Assistance with cooking/housework;Assist for transportation;Help with stairs or ramp for entrance   Equipment Recommendations  None recommended by PT (pt has recommended DME)    Recommendations for Other Services       Precautions / Restrictions Precautions Precautions:  None Restrictions Weight Bearing Restrictions: No Other Position/Activity Restrictions: wbat     Mobility  Bed Mobility Overal bed mobility: Needs Assistance Bed Mobility: Supine to Sit, Sit to Supine     Supine to sit: Supervision Sit to supine: Supervision   General bed mobility comments: Supervision for safety, no physical assist required.    Transfers Overall transfer level: Needs assistance Equipment used: Rolling walker (2 wheels) Transfers: Sit to/from Stand Sit to Stand: Min guard           General transfer comment: Pt min guard for safety, no physical assist required.    Ambulation/Gait Ambulation/Gait assistance: Min guard Gait Distance (Feet): 75 Feet Assistive device: Rolling walker (2 wheels) Gait Pattern/deviations: Step-to pattern Gait velocity: decreased     General Gait Details: Pt ambulated 82ft with RW and min guard, no physical assist required or overt LOB noted. Demonstrated step-to then step-through pattern, progressed to supervision level of assist with husband providing guarding.   Stairs Stairs: Yes Stairs assistance: Min guard, Min assist Stair Management: No rails, Step to pattern, Backwards, With walker Number of Stairs: 2 General stair comments: Pt and husband educated on stair mobility with RW backwards, verbalized understanding. Demonstrated safe technique with husband providing min assist to stabilize RW and min guard to patient, PT providing secondary min guard for additional safety. No overt LOB noted, VCs for sequencing. Handout provided.   Wheelchair Mobility    Modified Rankin (Stroke Patients Only)       Balance Overall balance assessment: Needs assistance Sitting-balance support: No upper extremity supported, Feet unsupported Sitting balance-Leahy Scale: Normal     Standing balance support: Bilateral upper extremity supported, During functional activity, Reliant on assistive device for balance Standing  balance-Leahy Scale: Poor  Cognition Arousal/Alertness: Awake/alert Behavior During Therapy: WFL for tasks assessed/performed Overall Cognitive Status: Within Functional Limits for tasks assessed                                          Exercises Total Joint Exercises Ankle Circles/Pumps: AROM, 10 reps, Both Quad Sets: AROM, Left, Other reps (comment) (2) Short Arc Quad: AROM, Left, Other reps (comment) (2) Heel Slides: AROM, Left, Other reps (comment) (2) Hip ABduction/ADduction: AROM, Left, Other reps (comment) (2) Straight Leg Raises: AROM, Left, Other reps (comment) (2`)    General Comments General comments (skin integrity, edema, etc.): Husband Glendell Docker present for session      Pertinent Vitals/Pain Pain Assessment Pain Assessment: No/denies pain    Home Living Family/patient expects to be discharged to:: Private residence Living Arrangements: Spouse/significant other Available Help at Discharge: Family;Available 24 hours/day Type of Home: House Home Access: Level entry       Home Layout: One level Home Equipment: Conservation officer, nature (2 wheels)      Prior Function            PT Goals (current goals can now be found in the care plan section) Acute Rehab PT Goals Patient Stated Goal: to walk without pain PT Goal Formulation: With patient Time For Goal Achievement: 01/03/22 Potential to Achieve Goals: Good Progress towards PT goals: Progressing toward goals    Frequency    7X/week      PT Plan Current plan remains appropriate    Co-evaluation              AM-PAC PT "6 Clicks" Mobility   Outcome Measure  Help needed turning from your back to your side while in a flat bed without using bedrails?: None Help needed moving from lying on your back to sitting on the side of a flat bed without using bedrails?: None Help needed moving to and from a bed to a chair (including a wheelchair)?: A  Little Help needed standing up from a chair using your arms (e.g., wheelchair or bedside chair)?: A Little Help needed to walk in hospital room?: A Little Help needed climbing 3-5 steps with a railing? : A Little 6 Click Score: 20    End of Session Equipment Utilized During Treatment: Gait belt Activity Tolerance: Patient tolerated treatment well;No increased pain Patient left: with call bell/phone within reach;in chair;with family/visitor present Nurse Communication: Mobility status PT Visit Diagnosis: Difficulty in walking, not elsewhere classified (R26.2)     Time: 1450-1510 PT Time Calculation (min) (ACUTE ONLY): 20 min  Charges:  $Gait Training: 8-22 mins                    Coolidge Breeze, PT, DPT [facility] Office: [phone] Pager: [phone]   Coolidge Breeze 12/27/2021, 3:12 PM

## 2021-12-27 NOTE — Interval H&P Note (Signed)
The patient has been re-examined, and the chart reviewed, and there have been no interval changes to the documented history and physical.    Plan for L TKA for OA. Plan for same day discharge.  Will recheck a BMP this morning given mildly elevated creatinine at the preop visit. This has been discussed with the patient.  The operative side was examined and the patient was confirmed to have. Sens DPN, SPN, TN intact, Motor EHL, ext, flex 5/5, and DP 2+, PT 2+, No significant edema.   The risks, benefits, and alternatives have been discussed at length with patient, and the patient is willing to proceed.  Left knee marked. Consent has been signed.

## 2021-12-27 NOTE — Anesthesia Procedure Notes (Addendum)
Anesthesia Regional Block: Adductor canal block   Pre-Anesthetic Checklist: , timeout performed,  Correct Patient, Correct Site, Correct Laterality,  Correct Procedure, Correct Position, site marked,  Risks and benefits discussed,  Surgical consent,  Pre-op evaluation,  At surgeon's request and post-op pain management  Laterality: Lower and Left  Prep: chloraprep       Needles:  Injection technique: Single-shot  Needle Type: Echogenic Needle     Needle Length: 9cm  Needle Gauge: 22     Additional Needles:   Procedures:,,,, ultrasound used (permanent image in chart),,    Narrative:  Start time: 12/27/2021 7:46 AM End time: 12/27/2021 7:53 AM Injection made incrementally with aspirations every 5 mL.  Performed by: Personally  Anesthesiologist: Barnet Glasgow, MD  Additional Notes: Block assessed prior to surgery. Pt tolerated procedure well.

## 2021-12-27 NOTE — Op Note (Addendum)
DATE OF SURGERY:  12/27/2021 TIME: 10:04 AM  PATIENT NAME:  Rachel Wood   AGE: 71 y.o.    PRE-OPERATIVE DIAGNOSIS: End-stage left knee osteoarthritis  POST-OPERATIVE DIAGNOSIS:  Same  PROCEDURE: Left total Knee Arthroplasty  SURGEON:  Pamelia Botto A Ayub Kirsh, MD   ASSISTANT: Izola Price, RNFA, present and scrubbed throughout the case, critical for assistance with exposure, retraction, instrumentation, and closure.   OPERATIVE IMPLANTS:  Cemented Zimmer persona femur size 5 narrow, size D tibia, 29 mm patella, 14 mm MC polyethylene liner Implant Name Type Inv. Item Serial No. Manufacturer Lot No. LRB No. Used Action  CEMENT BONE R 1X40 - [id number] Cement CEMENT BONE R 1X40  ZIMMER [employer]ON(ORTH,TRAU,BIO,SG) [id number] Left 2 Implanted  TIBIA STEM 5 DEG SZ D L KNEE - [id number] Knees TIBIA STEM 5 DEG SZ D L KNEE  ZIMMER [employer]ON(ORTH,TRAU,BIO,SG) [id number] Left 1 Implanted  COMP FEM CMT PERS SZ5 LT - [id number] Joint COMP FEM CMT PERS SZ5 LT  ZIMMER [employer]ON(ORTH,TRAU,BIO,SG) [id number] Left 1 Implanted  STEM POLY PAT PLY 72M KNEE - [id number] Knees STEM POLY PAT PLY 72M KNEE  ZIMMER [employer]ON(ORTH,TRAU,BIO,SG) [id number] Left 1 Implanted  INSERT ARTISURF PERS SZ 4-5 LT - [id number] Insert INSERT ARTISURF PERS SZ 4-5 LT  ZIMMER [employer]ON(ORTH,TRAU,BIO,SG) [id number] Left 1 Implanted      PREOPERATIVE INDICATIONS:  Rachel Wood is a 71 y.o. year old female with end stage bone on bone degenerative arthritis of the knee who failed conservative treatment, including injections, antiinflammatories, activity modification, and assistive devices, and had significant impairment of their activities of daily living, and elected for Total Knee Arthroplasty.   The risks, benefits, and alternatives were discussed at length including but not limited to the risks of infection, bleeding, nerve injury, stiffness, blood clots, the need for revision surgery, cardiopulmonary complications, among others, and they were willing to  proceed.  Patient had a mildly elevated creatinine preoperatively.  This was improved on recheck today.  Okay to proceed with surgery, but discussed avoiding NSAIDs postoperatively and encouraging adequate p.o. fluid intake postoperatively.  OPERATIVE FINDINGS AND UNIQUE ASPECTS OF THE CASE: Relative preoperative ligamentous laxity, relatively large polyethylene liner required for adequate balancing of the knee medial/lateral and achieving full extension without hyperextension  ESTIMATED BLOOD LOSS: 25cc  OPERATIVE DESCRIPTION:   Once adequate anesthesia, preoperative antibiotics, 2 gm of ancef,1 gm of Tranexamic Acid, and 8 mg of Decadron administered, the patient was positioned supine with a left thigh tourniquet placed.  The left lower extremity was prepped and draped in sterile fashion.  A time-  out was performed identifying the patient, planned procedure, and the appropriate extremity.     The leg was  exsanguinated, tourniquet elevated to 250 mmHg.  A midline incision was   made followed by median parapatellar arthrotomy. Anterior horn of the medial meniscus was released and resected. A medial release was performed, the infrapatellar fat pad was resected with care taken to protect the patellar tendon. The suprapatellar fat was removed to exposed the distal anterior femur. The anterior horn of the lateral meniscus and ACL were released.    Following initial  exposure, attention was first to the femur.  The femoral   canal was opened with a drill, canal was suctioned to try to prevent fat emboli.  An   intramedullary rod was passed set at 5 degrees valgus, 10 mm. The distal femur was resected.  Following this resection, the tibia was   subluxated anteriorly.  Using the extramedullary guide, 10 mm  of bone was resected off   the proximal lateral tibia.  We confirmed the gap would be   stable medially and laterally with a size 8m spacer block as well as confirmed that the tibial cut was  perpendicular in the coronal plane, checking with an alignment rod.    Once this was done, the posterior femoral referencing femoral sizer was placed under to the posterior condyles with 3 degrees of external rotational which was parallel to the transepicondylar axis and perpendicular to [employer] The femur was sized to be a size 5 in the anterior-  posterior dimension. The   anterior, posterior, and  chamfer cuts were made without difficulty nor   notching making certain that I was along the anterior cortex to help   with flexion gap stability.  Next a laminar spreader was placed with the knee in flexion and the medial lateral menisci were resected.  5 cc of the Exparel mixture was injected in the medial side of the back of the knee and 3 cc in the lateral side.  1/2 inch curved osteotome was used to resect posterior osteophyte that was then removed with a pituitary rongeur.       At this point, the tibia was sized to be a size D.  The size D tray was   then pinned in position. Trial reduction was now carried with a 5 femur,  D tibia, a 12 mm MC insert.  The knee was nicely balanced but had relative laxity in varus/valgus stress as well as a little bit of hyperextension.  This was improved with a 14 mm polyethylene liner.    The knee was slightly tight in flexion and the PCL was partially released.   Attention was next directed to the patella.  Precut  measurement was noted to be 21 mm.  I resected down to 13 mm and used a  221mpatellar button to restore patellar height as well as cover the cut surface.     The patella lug holes were drilled and a 29 mm patella poly trial was placed.    The knee was brought to full extension with good flexion stability with the patella   tracking through the trochlea without application of pressure.     Next the femoral component was again assessed and determined to be seated and appropriately lateralized.  The femoral lug holes were drilled.  The  femoral component was then removed.Tibial component was again assessed and felt to be seated and appropriately rotated with the medial third of the tubercle. The tibia was then drilled, and keel punched.     Final components were  opened and regular cement was mixed.      Final implants were then  cemented onto cleaned and dried cut surfaces of bone with the knee brought to extension with a 14 mm MC poly.  The knee was irrigated with sterile Betadine diluted in saline as well as pulse lavage normal saline. * The synovial lining was  then injected a dilute Exparel.      Once the cement had fully cured, excess cement was removed   throughout the knee.  I confirmed that I was satisfied with the range of   motion and stability, and the final 14 mm MC poly insert was chosen.  It was   placed into the knee.         The tourniquet had been let down at 69 minutes.  No significant   hemostasis was required.  The medial parapatellar arthrotomy was then reapproximated using #1 Stratafix sutures with the knee  in flexion.  The   remaining wound was closed with 0 stratafix, 2-0 Vicryl, and running 3-0 Monocryl.   The knee was cleaned, dried, dressed sterilely using Dermabond and   Aquacel dressing.  The patient was then  brought to recovery room in stable condition, tolerating the procedure  well. There were no complications.   Post op recs: WB: WBAT Abx: ancef periop Imaging: PACU xrays DVT prophylaxis: Aspirin '81mg'$  BID x4 weeks Follow up: 2 weeks after surgery for a wound check with Dr. Zachery Dakins at [facility].  Address: [address], [place], [place] [address]  Office Phone: [phone]  Charlies Constable, MD Orthopaedic Surgery

## 2021-12-27 NOTE — Evaluation (Signed)
Physical Therapy Evaluation Patient Details Name: Rachel Wood MRN: [id number] DOB: Aug 13, 1950 Today's Date: 12/27/2021  History of Present Illness  Pt is a 71yo female rpseenting s/p L-TKA on 12/27/21 PMH: hx of breast cancer, GERD, HTN, PONV, hx of stroke.  Clinical Impression  Rachel Wood is a 71 y.o. female POD 0 s/p L-TKA. Patient reports independence with mobility at baseline. Patient is now limited by functional impairments (see PT problem list below) and requires min guard for transfers. Upon standing, pt LLE buckling mildly, able to correct. Attempted steps at EOB and pt with antalgic gait pattern, assume secondary to anesthesia not cleared, further mobility deferred.  Patient instructed in exercises to facilitate ROM and circulation. Patient will benefit from continued skilled PT interventions to address impairments and progress towards PLOF. Will attempt second session for same-day discharge.        Recommendations for follow up therapy are one component of a multi-disciplinary discharge planning process, led by the attending physician.  Recommendations may be updated based on patient status, additional functional criteria and insurance authorization.  Follow Up Recommendations Follow physician's recommendations for discharge plan and follow up therapies    Assistance Recommended at Discharge Intermittent Supervision/Assistance  Patient can return home with the following  A little help with walking and/or transfers;A little help with bathing/dressing/bathroom;Assistance with cooking/housework;Assist for transportation;Help with stairs or ramp for entrance    Equipment Recommendations None recommended by PT (pt has recommended DME)  Recommendations for Other Services       Functional Status Assessment Patient has had a recent decline in their functional status and demonstrates the ability to make significant improvements in function in a reasonable and predictable amount of  time.     Precautions / Restrictions Precautions Precautions: None Restrictions Other Position/Activity Restrictions: wbat      Mobility  Bed Mobility Overal bed mobility: Needs Assistance Bed Mobility: Supine to Sit, Sit to Supine     Supine to sit: Supervision Sit to supine: Supervision   General bed mobility comments: Supervision for safety, no physical assist required.    Transfers Overall transfer level: Needs assistance Equipment used: Rolling walker (2 wheels) Transfers: Sit to/from Stand Sit to Stand: Min guard           General transfer comment: Pt min guard for safety, no physical assist required. Upon standing, pt LLE unable to support weight with mild buckling that pt can correct, pt reporting decreased sensation in bilateral buttocks. Attempted forward and backward steps (2) and pt demonstrating antaglic gait, likely secondary to anesthesia, further mobility deferred    Ambulation/Gait               General Gait Details: deferred  Stairs            Wheelchair Mobility    Modified Rankin (Stroke Patients Only)       Balance Overall balance assessment: Needs assistance Sitting-balance support: No upper extremity supported, Feet unsupported Sitting balance-Leahy Scale: Normal     Standing balance support: Bilateral upper extremity supported, During functional activity, Reliant on assistive device for balance Standing balance-Leahy Scale: Poor                               Pertinent Vitals/Pain Pain Assessment Pain Assessment: No/denies pain    Home Living Family/patient expects to be discharged to:: Private residence Living Arrangements: Spouse/significant other Available Help at Discharge: Family;Available 24 hours/day Type of Home:  House Home Access: Level entry       Home Layout: One level Home Equipment: Conservation officer, nature (2 wheels)      Prior Function Prior Level of Function : Working/employed Producer, television/film/video)              Mobility Comments: ind ADLs Comments: ind     Hand Dominance        Extremity/Trunk Assessment   Upper Extremity Assessment Upper Extremity Assessment: Overall WFL for tasks assessed    Lower Extremity Assessment Lower Extremity Assessment: RLE deficits/detail;LLE deficits/detail RLE Deficits / Details: MMT ank DF/PF 4/5 RLE Sensation: WNL LLE Deficits / Details: MMT ank DF/PF 4/5, no extensor lag noted. LLE Sensation: WNL    Cervical / Trunk Assessment Cervical / Trunk Assessment: Normal  Communication   Communication: No difficulties  Cognition Arousal/Alertness: Awake/alert Behavior During Therapy: WFL for tasks assessed/performed Overall Cognitive Status: Within Functional Limits for tasks assessed                                          General Comments      Exercises     Assessment/Plan    PT Assessment Patient needs continued PT services  PT Problem List Decreased strength;Decreased range of motion;Decreased activity tolerance;Decreased balance;Decreased mobility;Decreased coordination;Pain       PT Treatment Interventions DME instruction;Gait training;Stair training;Functional mobility training;Therapeutic activities;Therapeutic exercise;Balance training;Neuromuscular re-education;Patient/family education    PT Goals (Current goals can be found in the Care Plan section)  Acute Rehab PT Goals Patient Stated Goal: to walk without pain PT Goal Formulation: With patient Time For Goal Achievement: 01/03/22 Potential to Achieve Goals: Good    Frequency 7X/week     Co-evaluation               AM-PAC PT "6 Clicks" Mobility  Outcome Measure Help needed turning from your back to your side while in a flat bed without using bedrails?: None Help needed moving from lying on your back to sitting on the side of a flat bed without using bedrails?: None Help needed moving to and from a bed to a chair (including a  wheelchair)?: A Little Help needed standing up from a chair using your arms (e.g., wheelchair or bedside chair)?: A Little Help needed to walk in hospital room?: A Little Help needed climbing 3-5 steps with a railing? : A Little 6 Click Score: 20    End of Session Equipment Utilized During Treatment: Gait belt Activity Tolerance: Treatment limited secondary to medical complications (Comment) (Pt not cleared anesthesia) Patient left: in bed;with call bell/phone within reach Nurse Communication: Mobility status PT Visit Diagnosis: Difficulty in walking, not elsewhere classified (R26.2)    Time: 1325-1350 PT Time Calculation (min) (ACUTE ONLY): 25 min   Charges:   PT Evaluation $PT Eval Low Complexity: 1 Low        Coolidge Breeze, PT, DPT [facility] Office: [phone] Pager: [phone]  Coolidge Breeze 12/27/2021, 1:51 PM

## 2021-12-27 NOTE — Progress Notes (Signed)
Orthopedic Tech Progress Note Patient Details:  Rachel Wood [birth year] [id number]  Patient ID: [name], female   DOB: 20-Jan-1951, 71 y.o.   MRN: [id number]  [name] 12/27/2021, 10:40 AM Bone foam applied to left leg in pacu

## 2021-12-28 ENCOUNTER — Encounter (HOSPITAL_COMMUNITY): Payer: Self-pay | Admitting: Orthopedic Surgery

## 2021-12-29 ENCOUNTER — Ambulatory Visit (HOSPITAL_COMMUNITY): Payer: Medicare Other

## 2021-12-29 NOTE — Therapy (Incomplete)
OUTPATIENT PHYSICAL THERAPY LOWER EXTREMITY EVALUATION   Patient Name: Rachel Wood MRN: [id number] DOB:April 06, 1951, 71 y.o., female Today's Date: 12/29/2021    Past Medical History:  Diagnosis Date   Arthritis    Cancer ([place]) 2004   Breast Cancer   Constipation    Depression    GERD (gastroesophageal reflux disease)    History of kidney stones    Hypercholesteremia    Hypertension    Osteoporosis    Personal history of chemotherapy 2004   Personal history of radiation therapy 2004   PONV (postoperative nausea and vomiting)    Stroke [facility])    While on Tamoxifen   Past Surgical History:  Procedure Laterality Date   CHOLECYSTECTOMY     COLONOSCOPY  12/24/2011   Procedure: COLONOSCOPY;  Surgeon: Danie Binder, MD;  Location: [facility] END[facility] SUITE;  Service: Endoscopy;  Laterality: N/A;  8:30 AM   Left breast lumpectomy  2004   LYMPH GLAND EXCISION     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   Procedure: TOTAL KNEE ARTHROPLASTY;  Surgeon: Willaim Sheng, MD;  Location: [facility] ORS;  Service: Orthopedics;  Laterality: Left;   TRACHEOSTOMY     as a child   Patient Active Problem List   Diagnosis Date Noted   Gastroesophageal reflux disease without esophagitis 05/01/2018   Hypertension 03/07/2016   History of melanoma 12/20/2014   Hyperlipidemia 12/20/2014    PCP: ***  REFERRING PROVIDER: ***  REFERRING DIAG: PT eval and tx Post OP left knee Replacement   THERAPY DIAG:  No diagnosis found.  Rationale for Evaluation and Treatment {HABREHAB:27488}  ONSET DATE: ***  SUBJECTIVE:   SUBJECTIVE STATEMENT: ***  PERTINENT HISTORY: Left Total Knee Arthroplasty 12/27/21  PAIN:  Are you having pain? {OPRCPAIN:27236}  PRECAUTIONS: {Therapy precautions:24002}  WEIGHT BEARING RESTRICTIONS {Yes ***/No:24003}  FALLS:  Has patient fallen in last 6 months? {fallsyesno:27318}  LIVING ENVIRONMENT: Lives with: {OPRC lives with:25569::"lives with their family"} Lives in:  {Lives in:25570} Stairs: {opstairs:27293} Has following equipment at home: {Assistive devices:23999}  OCCUPATION: ***  PLOF: {PLOF:24004}  PATIENT GOALS ***   OBJECTIVE:   DIAGNOSTIC FINDINGS: ***  PATIENT SURVEYS:  {rehab surveys:24030}  COGNITION:  Overall cognitive status: {cognition:24006}     SENSATION: {sensation:27233}  EDEMA:  {edema:24020}  MUSCLE LENGTH: Hamstrings: Right *** deg; Left *** deg Thomas test: Right *** deg; Left *** deg  POSTURE: {posture:25561}  PALPATION: ***  LOWER EXTREMITY ROM:  {AROM/PROM:27142} ROM Right eval Left eval  Hip flexion    Hip extension    Hip abduction    Hip adduction    Hip internal rotation    Hip external rotation    Knee flexion    Knee extension    Ankle dorsiflexion    Ankle plantarflexion    Ankle inversion    Ankle eversion     (Blank rows = not tested)  LOWER EXTREMITY MMT:  MMT Right eval Left eval  Hip flexion    Hip extension    Hip abduction    Hip adduction    Hip internal rotation    Hip external rotation    Knee flexion    Knee extension    Ankle dorsiflexion    Ankle plantarflexion    Ankle inversion    Ankle eversion     (Blank rows = not tested)  LOWER EXTREMITY SPECIAL TESTS:  {LEspecialtests:26242}  FUNCTIONAL TESTS:  {Functional tests:24029}  GAIT: Distance walked: *** Assistive device utilized: {Assistive devices:23999} Level of assistance: {Levels  of assistance:24026} Comments: ***    TODAY'S TREATMENT: ***   PATIENT EDUCATION:  Education details: *** Person educated: {Person educated:25204} Education method: {Education Method:25205} Education comprehension: {Education Comprehension:25206}   HOME EXERCISE PROGRAM: ***  ASSESSMENT:  CLINICAL IMPRESSION: Patient a 71 y.o. y.o. female who was seen today for physical therapy evaluation and treatment for ***.     OBJECTIVE IMPAIRMENTS {opptimpairments:25111}.   ACTIVITY LIMITATIONS  {activitylimitations:27494}  PARTICIPATION LIMITATIONS: {participationrestrictions:25113}  PERSONAL FACTORS {Personal factors:25162} are also affecting patient's functional outcome.   REHAB POTENTIAL: {rehabpotential:25112}  CLINICAL DECISION MAKING: {clinical decision making:25114}  EVALUATION COMPLEXITY: {Evaluation complexity:25115}   GOALS: Goals reviewed with patient? {yes/no:20286}  SHORT TERM GOALS: Target date: {follow up:25551}  Patient will be independent with HEP in order to improve functional outcomes. Baseline: *** Goal status: {GOALSTATUS:25110}  2.  Patient will report at least 25% improvement in symptoms for improved quality of life. Baseline: *** Goal status: {GOALSTATUS:25110}  3.  *** Baseline: *** Goal status: {GOALSTATUS:25110}  4.  *** Baseline: *** Goal status: {GOALSTATUS:25110}  5.  *** Baseline: *** Goal status: {GOALSTATUS:25110}  6.  *** Baseline: *** Goal status: {GOALSTATUS:25110}  LONG TERM GOALS: Target date: {follow up:25551}  Patient will report at least 75% improvement in symptoms for improved quality of life. Baseline: *** Goal status: {GOALSTATUS:25110}  2.  Patient will improve FOTO score by at least *** points in order to indicate improved tolerance to activity. Baseline: *** Goal status: {GOALSTATUS:25110}  3.  Patient will be able to navigate stairs with reciprocal pattern without compensation in order to demonstrate improved LE strength. Baseline: *** Goal status: {GOALSTATUS:25110}  4.  Patient will be able to ambulate at least *** feet in 2MWT in order to demonstrate improved tolerance to activity. Baseline: *** Goal status: {GOALSTATUS:25110}  5.  Patient will improve ROM for *** knee extension/flexion to 0-115 degrees to improve squatting, and other functional mobility. Baseline: *** Goal status: {GOALSTATUS:25110}  6.  *** Baseline: *** Goal status: {GOALSTATUS:25110}   PLAN: PT FREQUENCY: {rehab  frequency:25116}  PT DURATION: {rehab duration:25117}  PLANNED INTERVENTIONS: {rehab planned interventions:25118::"Therapeutic exercises","Therapeutic activity","Neuromuscular re-education","Balance training","Gait training","Patient/Family education","Joint mobilization"}  PLAN FOR NEXT SESSION: ***   Arlo Buffone, PT 12/29/2021, 7:17 AM

## 2022-01-01 ENCOUNTER — Encounter (HOSPITAL_COMMUNITY): Payer: Medicare Other | Admitting: Physical Therapy

## 2022-01-04 ENCOUNTER — Encounter (HOSPITAL_COMMUNITY): Payer: Medicare Other

## 2022-01-08 ENCOUNTER — Ambulatory Visit (HOSPITAL_COMMUNITY): Payer: Medicare Other | Attending: Orthopedic Surgery | Admitting: Physical Therapy

## 2022-01-08 ENCOUNTER — Encounter (HOSPITAL_COMMUNITY): Payer: Self-pay | Admitting: Physical Therapy

## 2022-01-08 DIAGNOSIS — M25662 Stiffness of left knee, not elsewhere classified: Secondary | ICD-10-CM | POA: Insufficient documentation

## 2022-01-08 DIAGNOSIS — R2689 Other abnormalities of gait and mobility: Secondary | ICD-10-CM | POA: Insufficient documentation

## 2022-01-08 DIAGNOSIS — M25562 Pain in left knee: Secondary | ICD-10-CM | POA: Diagnosis present

## 2022-01-08 NOTE — Therapy (Signed)
OUTPATIENT PHYSICAL THERAPY LOWER EXTREMITY EVALUATION   Patient Name: Rachel Wood MRN: [id number] DOB:03-16-1951, 71 y.o., female Today's Date: 01/08/2022   PT End of Session - 01/08/22 1341     Visit Number 1    Number of Visits 12    Date for PT Re-Evaluation 02/19/22    Authorization Type U[employer] Medicare (no auth, no VL)    Progress Note Due on Visit 10    PT Start Time 1303    PT Stop Time 1343    PT Time Calculation (min) 40 min    Activity Tolerance Patient tolerated treatment well    Behavior During Therapy [facility] for tasks assessed/performed             Past Medical History:  Diagnosis Date   Arthritis    Cancer (HCC) 2004   Breast Cancer   Constipation    Depression    GERD (gastroesophageal reflux disease)    History of kidney stones    Hypercholesteremia    Hypertension    Osteoporosis    Personal history of chemotherapy 2004   Personal history of radiation therapy 2004   PONV (postoperative nausea and vomiting)    Stroke [facility])    While on Tamoxifen   Past Surgical History:  Procedure Laterality Date   CHOLECYSTECTOMY     COLONOSCOPY  12/24/2011   Procedure: COLONOSCOPY;  Surgeon: West Bali, MD;  [facility];  Service: Endoscopy;  Laterality: N/A;  8:30 AM   Left breast lumpectomy  2004   LYMPH GLAND EXCISION     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   Procedure: TOTAL KNEE ARTHROPLASTY;  Surgeon: Joen Laura, MD;  Location: [facility];  Service: Orthopedics;  Laterality: Left;   TRACHEOSTOMY     as a child   Patient Active Problem List   Diagnosis Date Noted   Gastroesophageal reflux disease without esophagitis 05/01/2018   Hypertension 03/07/2016   History of melanoma 12/20/2014   Hyperlipidemia 12/20/2014    PCP: Antony Haste MD   REFERRING PROVIDER: Joen Laura, MD   REFERRING DIAG: Post OP left knee Replacement   THERAPY DIAG:  Left knee pain, unspecified chronicity  Stiffness of left knee, not  elsewhere classified  Other abnormalities of gait and mobility  Rationale for Evaluation and Treatment Rehabilitation  ONSET DATE: DOS: 12/27/21  SUBJECTIVE:   SUBJECTIVE STATEMENT: Patient presents to therapy with complaint of LT knee pain s/p LT TKA on 12/27/21. Patient is limited by pain and noted tightness with stitches that are still in place. She is able to ambulate with no AD. She is using CPM at home and has it up to about 90 degrees. She is taking tylenol for pain.   PERTINENT HISTORY: RT TKA 2022  PAIN:  Are you having pain? Yes: NPRS scale: 6/10 Pain location: Lt knee Pain description: sore,tightness, aching   Aggravating factors: Walking, WB Relieving factors: rest , meds   PRECAUTIONS: None  WEIGHT BEARING RESTRICTIONS No  FALLS:  Has patient fallen in last 6 months? No  LIVING ENVIRONMENT: Lives with: lives with their spouse Lives in: House/apartment Stairs: Yes: External: 1 steps; none Has following equipment at home: Environmental consultant - 2 wheeled  OCCUPATION: Visual merchandiser   PLOF: Independent  PATIENT GOALS  Be able to work in my yard and be on my feet without pain   OBJECTIVE:   DIAGNOSTIC FINDINGS: NA  PATIENT SURVEYS:  FOTO 44% function   COGNITION:  Overall cognitive status:  Within functional limits for tasks assessed     SENSATION: WFL  EDEMA:  Minimal   PALPATION: Min TTP   LOWER EXTREMITY ROM:  Active ROM (degrees) Right eval Left eval  Hip flexion    Hip extension    Hip abduction    Hip adduction    Hip internal rotation    Hip external rotation    Knee flexion  88  Knee extension  -4  Ankle dorsiflexion    Ankle plantarflexion    Ankle inversion    Ankle eversion     (Blank rows = not tested)  LOWER EXTREMITY MMT:  MMT Right eval Left eval  Hip flexion 5 4  Hip extension    Hip abduction    Hip adduction    Hip internal rotation    Hip external rotation    Knee flexion 5 4  Knee extension 5 4  Ankle  dorsiflexion 5 4  Ankle plantarflexion    Ankle inversion    Ankle eversion     (Blank rows = not tested)   FUNCTIONAL TESTS:  2 minute walk test: 280 feet with no AD  GAIT: Distance walked: 280 feet Assistive device utilized: None Level of assistance: SBA Comments:    TODAY'S TREATMENT: 01/08/22 Quad set Glute set Ankle pump SLR Heel slide    PATIENT EDUCATION:  Education details: on evaluation findings, POC and HEP  Person educated: Patient Education method: Explanation Education comprehension: verbalized understanding   HOME EXERCISE PROGRAM: Reviewed post op handout, same as above  ASSESSMENT:  CLINICAL IMPRESSION: Patient is a 71 y.o. female who presents to physical therapy with complaint of LT TKA . Patient demonstrates muscle weakness, reduced ROM, and fascial restrictions which are likely contributing to symptoms of pain and are negatively impacting patient ability to perform ADLs and functional mobility tasks. Patient will benefit from skilled physical therapy services to address these deficits to reduce pain and improve level of function with ADLs and functional mobility tasks.  OBJECTIVE IMPAIRMENTS Abnormal gait, decreased activity tolerance, decreased balance, decreased mobility, difficulty walking, decreased ROM, decreased strength, hypomobility, increased edema, increased fascial restrictions, impaired flexibility, improper body mechanics, and pain.   ACTIVITY LIMITATIONS lifting, bending, sitting, standing, squatting, sleeping, stairs, transfers, and locomotion level  PARTICIPATION LIMITATIONS: meal prep, cleaning, laundry, driving, shopping, community activity, occupation, and yard work  PERSONAL FACTORS  None  are also affecting patient's functional outcome.   REHAB POTENTIAL: Good  CLINICAL DECISION MAKING: Stable/uncomplicated  EVALUATION COMPLEXITY: Low   GOALS: SHORT TERM GOALS: Target date: 02/05/2022  Patient will be independent  with initial HEP and self-management strategies to improve functional outcomes Baseline:  Goal status: INITIAL   LONG TERM GOALS: Target date: 02/19/2022  Patient will be independent with advanced HEP and self-management strategies to improve functional outcomes Baseline:  Goal status: INITIAL  2.  Patient will improve FOTO score to predicted value to indicate improvement in functional outcomes Baseline: 44% function Goal status: INITIAL  3.  Patient will have LT knee AROM 0-120 degrees to improve functional mobility and facilitate squatting to pick up items from floor. Baseline: -4 to 88 degrees Goal status: INITIAL  4. Patient will have equal to or > 4+/5 MMT throughout  to improve ability to perform functional mobility, stair ambulation and ADLs.  Baseline: See MMT Goal status: INITIAL    5. Patient will be able to ambulate at least 350 feet during with LRAD to demonstrate improved ability to perform functional mobility  and associated tasks. Baseline: 280 feet no AD Goal status: INITIAL PLAN: PT FREQUENCY: 2x/week  PT DURATION: 6 weeks  PLANNED INTERVENTIONS: Therapeutic exercises, Therapeutic activity, Neuromuscular re-education, Balance training, Gait training, Patient/Family education, Joint manipulation, Joint mobilization, Stair training, Aquatic Therapy, Dry Needling, Electrical stimulation, Spinal manipulation, Spinal mobilization, Cryotherapy, Moist heat, scar mobilization, Taping, Traction, Ultrasound, Biofeedback, Ionotophoresis 4mg /ml Dexamethasone, and Manual therapy.   PLAN FOR NEXT SESSION: Progress knee strength and AROM. Add gait and balance when able   1:42 PM, 01/08/22 Georges Lynch PT DPT  Physical Therapist with [facility]  [phone]

## 2022-01-10 ENCOUNTER — Ambulatory Visit (HOSPITAL_COMMUNITY): Payer: Medicare Other | Admitting: Physical Therapy

## 2022-01-10 DIAGNOSIS — M25662 Stiffness of left knee, not elsewhere classified: Secondary | ICD-10-CM

## 2022-01-10 DIAGNOSIS — M25562 Pain in left knee: Secondary | ICD-10-CM

## 2022-01-10 DIAGNOSIS — R2689 Other abnormalities of gait and mobility: Secondary | ICD-10-CM

## 2022-01-10 NOTE — Therapy (Signed)
OUTPATIENT PHYSICAL THERAPY TREATMENT   Patient Name: Rachel Wood MRN: [id number] DOB:[birth year], 71 y.o., female Today's Date: 01/10/2022   PT End of Session - 01/10/22 1330     Visit Number 2    Number of Visits 12    Date for PT Re-Evaluation 02/19/22    Authorization Type [employer]HC Medicare (no auth, no VL)    Progress Note Due on Visit 10    PT Start Time 2426    PT Stop Time 1355    PT Time Calculation (min) 38 min    Activity Tolerance Patient tolerated treatment well    Behavior During Therapy [facility] for tasks assessed/performed              Past Medical History:  Diagnosis Date   Arthritis    Cancer ([place]) 2004   Breast Cancer   Constipation    Depression    GERD (gastroesophageal reflux disease)    History of kidney stones    Hypercholesteremia    Hypertension    Osteoporosis    Personal history of chemotherapy 2004   Personal history of radiation therapy 2004   PONV (postoperative nausea and vomiting)    Stroke [facility])    While on Tamoxifen   Past Surgical History:  Procedure Laterality Date   CHOLECYSTECTOMY     COLONOSCOPY  12/24/2011   Procedure: COLONOSCOPY;  Surgeon: Danie Binder, MD;  [facility];  Service: Endoscopy;  Laterality: N/A;  8:30 AM   Left breast lumpectomy  2004   LYMPH GLAND EXCISION     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   Procedure: TOTAL KNEE ARTHROPLASTY;  Surgeon: Willaim Sheng, MD;  [facility];  Service: Orthopedics;  Laterality: Left;   TRACHEOSTOMY     as a child   Patient Active Problem List   Diagnosis Date Noted   Gastroesophageal reflux disease without esophagitis 05/01/2018   Hypertension 03/07/2016   History of melanoma 12/20/2014   Hyperlipidemia 12/20/2014    PCP: Anastasia Pall MD   REFERRING PROVIDER: Willaim Sheng, MD   REFERRING DIAG: Post OP left knee Replacement   THERAPY DIAG:  Left knee pain, unspecified chronicity  Stiffness of left knee, not elsewhere  classified  Other abnormalities of gait and mobility  Rationale for Evaluation and Treatment Rehabilitation  ONSET DATE: DOS: 12/27/21  SUBJECTIVE:   SUBJECTIVE STATEMENT: Patient comes today without AD 2 weeks post op.  States she returns to MD tomorrow and can't wait to get her stitches out.   No pain reported today unless she bends it into end ROM and then it's only 3/10.   PERTINENT HISTORY: Lt TKA 12/27/21; RT TKA 2022  PAIN:  Are you having pain? Yes: NPRS scale: 3/10 Pain location: Lt knee Pain description: sore,tightness, aching   Aggravating factors: Walking, WB Relieving factors: rest , meds   PRECAUTIONS: None  WEIGHT BEARING RESTRICTIONS No  FALLS:  Has patient fallen in last 6 months? No  LIVING ENVIRONMENT: Lives with: lives with their spouse Lives in: House/apartment Stairs: Yes: External: 1 steps; none Has following equipment at home: Environmental consultant - 2 wheeled  OCCUPATION: Fish farm manager   PLOF: Independent  PATIENT GOALS  Be able to work in my yard and be on my feet without pain   OBJECTIVE:   DIAGNOSTIC FINDINGS: NA  PATIENT SURVEYS:  FOTO 44% function   COGNITION:  Overall cognitive status: Within functional limits for tasks assessed     SENSATION: WFL  EDEMA:  Minimal   PALPATION: Min TTP   LOWER EXTREMITY ROM:  Active ROM (degrees) Right eval Left eval Left 01/10/22  Hip flexion     Hip extension     Hip abduction     Hip adduction     Hip internal rotation     Hip external rotation     Knee flexion  88 100  Knee extension  -4 -3  Ankle dorsiflexion     Ankle plantarflexion     Ankle inversion     Ankle eversion      (Blank rows = not tested)  LOWER EXTREMITY MMT:  MMT Right eval Left eval  Hip flexion 5 4  Hip extension    Hip abduction    Hip adduction    Hip internal rotation    Hip external rotation    Knee flexion 5 4  Knee extension 5 4  Ankle dorsiflexion 5 4  Ankle plantarflexion    Ankle inversion     Ankle eversion     (Blank rows = not tested)   FUNCTIONAL TESTS:  2 minute walk test: 280 feet with no AD  GAIT: Distance walked: 280 feet Assistive device utilized: None Level of assistance: SBA Comments: 2MWT    TODAY'S TREATMENT: 01/10/22 Bike seat 8 rocking 4 minutes AAROM Standing: heelraises on incline 10X AROM  Knee flexion stretch with 12" step 10X10" AAROM  Lt knee flexion 10X AROM  Slant board stretch 3X30" Supine:  Quad set AROM 10X5"  SAQ AROM 10X5"  Heelslides AROM 10X  SLR with QS 10X AROM  Bridge AROM 10X      01/08/22 Quad set Glute set Ankle pump SLR Heel slide    PATIENT EDUCATION:  Education details: on evaluation findings, POC and HEP  Person educated: Patient Education method: Explanation Education comprehension: verbalized understanding   HOME EXERCISE PROGRAM: Reviewed post op handout, same as above  ASSESSMENT:  CLINICAL IMPRESSION: Reviewed goals, HEP and POC moving forward.  Pt is doing well, ahead of target at this point for function and ROM.  Pt does have antalgic gait, however able to correct with VC's for longer stride, heel to toe gait.  AROM measured today at -3 to 100 following therex.  Progressed to standing AROM and stretching for Lt knee without difficulty. Patient will benefit from skilled physical therapy services to address these deficits to reduce pain and improve level of function with ADLs and functional mobility tasks.  OBJECTIVE IMPAIRMENTS Abnormal gait, decreased activity tolerance, decreased balance, decreased mobility, difficulty walking, decreased ROM, decreased strength, hypomobility, increased edema, increased fascial restrictions, impaired flexibility, improper body mechanics, and pain.   ACTIVITY LIMITATIONS lifting, bending, sitting, standing, squatting, sleeping, stairs, transfers, and locomotion level  PARTICIPATION LIMITATIONS: meal prep, cleaning, laundry, driving, shopping, community activity,  occupation, and yard work  PERSONAL FACTORS  None  are also affecting patient's functional outcome.   REHAB POTENTIAL: Good  CLINICAL DECISION MAKING: Stable/uncomplicated  EVALUATION COMPLEXITY: Low   GOALS: SHORT TERM GOALS: Target date: 02/07/2022  Patient will be independent with initial HEP and self-management strategies to improve functional outcomes Baseline:  Goal status: IN PROGRESS   LONG TERM GOALS: Target date: 02/21/2022  Patient will be independent with advanced HEP and self-management strategies to improve functional outcomes Baseline:  Goal status: IN PROGRESS  2.  Patient will improve FOTO score to predicted value to indicate improvement in functional outcomes Baseline: 44% function Goal status: IN PROGRESS  3.  Patient will have LT knee  AROM 0-120 degrees to improve functional mobility and facilitate squatting to pick up items from floor. Baseline: -4 to 88 degrees Goal status: IN PROGRESS  4. Patient will have equal to or > 4+/5 MMT throughout  to improve ability to perform functional mobility, stair ambulation and ADLs.  Baseline: See MMT Goal status: IN PROGRESS    5. Patient will be able to ambulate at least 350 feet during 2MWT with LRAD to demonstrate improved ability to perform functional mobility and associated tasks. Baseline: 280 feet no AD Goal status: IN PROGRESS PLAN: PT FREQUENCY: 2x/week  PT DURATION: 6 weeks  PLANNED INTERVENTIONS: Therapeutic exercises, Therapeutic activity, Neuromuscular re-education, Balance training, Gait training, Patient/Family education, Joint manipulation, Joint mobilization, Stair training, Aquatic Therapy, Dry Needling, Electrical stimulation, Spinal manipulation, Spinal mobilization, Cryotherapy, Moist heat, scar mobilization, Taping, Traction, Ultrasound, Biofeedback, Ionotophoresis '4mg'$ /ml Dexamethasone, and Manual therapy.   PLAN FOR NEXT SESSION: Progress knee strength and AROM. Add gait and balance when able    1:32 PM, 01/10/22 Teena Irani, PTA/CLT [place] Ph: [phone]

## 2022-01-11 ENCOUNTER — Other Ambulatory Visit (HOSPITAL_COMMUNITY): Payer: Medicare Other

## 2022-01-15 ENCOUNTER — Ambulatory Visit (HOSPITAL_COMMUNITY): Payer: Medicare Other | Attending: Orthopedic Surgery | Admitting: Physical Therapy

## 2022-01-15 ENCOUNTER — Encounter (HOSPITAL_COMMUNITY): Payer: Self-pay | Admitting: Physical Therapy

## 2022-01-15 DIAGNOSIS — M25562 Pain in left knee: Secondary | ICD-10-CM | POA: Insufficient documentation

## 2022-01-15 DIAGNOSIS — M25662 Stiffness of left knee, not elsewhere classified: Secondary | ICD-10-CM | POA: Diagnosis present

## 2022-01-15 DIAGNOSIS — R2689 Other abnormalities of gait and mobility: Secondary | ICD-10-CM | POA: Insufficient documentation

## 2022-01-15 NOTE — Therapy (Signed)
OUTPATIENT PHYSICAL THERAPY TREATMENT   Patient Name: Rachel Wood MRN: [id number] DOB:Sep 23, 1950, 71 y.o., female Today's Date: 01/15/2022   PT End of Session - 01/15/22 1116     Visit Number 3    Number of Visits 12    Date for PT Re-Evaluation 02/19/22    Authorization Type [employer] Medicare (no auth, no VL)    Progress Note Due on Visit 10    PT Start Time 1117    PT Stop Time 1156    PT Time Calculation (min) 39 min    Activity Tolerance Patient tolerated treatment well    Behavior During Therapy [facility] for tasks assessed/performed              Past Medical History:  Diagnosis Date   Arthritis    Cancer ([place]) 2004   Breast Cancer   Constipation    Depression    GERD (gastroesophageal reflux disease)    History of kidney stones    Hypercholesteremia    Hypertension    Osteoporosis    Personal history of chemotherapy 2004   Personal history of radiation therapy 2004   PONV (postoperative nausea and vomiting)    Stroke [facility])    While on Tamoxifen   Past Surgical History:  Procedure Laterality Date   CHOLECYSTECTOMY     COLONOSCOPY  12/24/2011   Procedure: COLONOSCOPY;  Surgeon: Danie Binder, MD;  [facility];  Service: Endoscopy;  Laterality: N/A;  8:30 AM   Left breast lumpectomy  2004   LYMPH GLAND EXCISION     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   Procedure: TOTAL KNEE ARTHROPLASTY;  Surgeon: Willaim Sheng, MD;  Location: [facility];  Service: Orthopedics;  Laterality: Left;   TRACHEOSTOMY     as a child   Patient Active Problem List   Diagnosis Date Noted   Gastroesophageal reflux disease without esophagitis 05/01/2018   Hypertension 03/07/2016   History of melanoma 12/20/2014   Hyperlipidemia 12/20/2014    PCP: Anastasia Pall MD   REFERRING PROVIDER: Willaim Sheng, MD   REFERRING DIAG: Post OP left knee Replacement   THERAPY DIAG:  Left knee pain, unspecified chronicity  Stiffness of left knee, not elsewhere  classified  Other abnormalities of gait and mobility  Rationale for Evaluation and Treatment Rehabilitation  ONSET DATE: DOS: 12/27/21  SUBJECTIVE:   SUBJECTIVE STATEMENT: Patient states knee pain and cramps at night, knee burning.   PERTINENT HISTORY: Lt TKA 12/27/21; RT TKA 2022  PAIN:  Are you having pain? Yes: NPRS scale: 3/10 Pain location: Lt knee Pain description: sore,tightness, aching   Aggravating factors: Walking, WB Relieving factors: rest , meds   PRECAUTIONS: None  WEIGHT BEARING RESTRICTIONS No  FALLS:  Has patient fallen in last 6 months? No  LIVING ENVIRONMENT: Lives with: lives with their spouse Lives in: House/apartment Stairs: Yes: External: 1 steps; none Has following equipment at home: Environmental consultant - 2 wheeled  OCCUPATION: Fish farm manager   PLOF: Independent  PATIENT GOALS  Be able to work in my yard and be on my feet without pain   OBJECTIVE:   DIAGNOSTIC FINDINGS: NA  PATIENT SURVEYS:  FOTO 44% function   COGNITION:  Overall cognitive status: Within functional limits for tasks assessed     SENSATION: WFL  EDEMA:  Minimal   PALPATION: Min TTP   LOWER EXTREMITY ROM:  Active ROM (degrees) Right eval Left eval Left 01/10/22 Left 01/15/22  Hip flexion  Hip extension      Hip abduction      Hip adduction      Hip internal rotation      Hip external rotation      Knee flexion  88 100 101 improves to 108 with slides  Knee extension  -4 -3 0  Ankle dorsiflexion      Ankle plantarflexion      Ankle inversion      Ankle eversion       (Blank rows = not tested)  LOWER EXTREMITY MMT:  MMT Right eval Left eval  Hip flexion 5 4  Hip extension    Hip abduction    Hip adduction    Hip internal rotation    Hip external rotation    Knee flexion 5 4  Knee extension 5 4  Ankle dorsiflexion 5 4  Ankle plantarflexion    Ankle inversion    Ankle eversion     (Blank rows = not tested)   FUNCTIONAL TESTS:  2 minute walk test:  280 feet with no AD  GAIT: Distance walked: 280 feet Assistive device utilized: None Level of assistance: SBA Comments: 2MWT    TODAY'S TREATMENT: 01/15/22 Bike rocking for dynamic warm up and holds at end range flexion 5 minutes SLR with quad set 2x 10  Heel slides 10 x 10 second holds Hamstring isometric in supine with red ball 10 x 10 second holds HR 1x 20 Hamstring curls 1x 15 LLE Mini squat 2x 10 with bilateral UE support Slant board stretch 3X30" LAQ 10 x 5 second holds Step up 4 inch 2x 10    01/10/22 Bike seat 8 rocking 4 minutes AAROM Standing: heelraises on incline 10X AROM  Knee flexion stretch with 12" step 10X10" AAROM  Lt knee flexion 10X AROM  Slant board stretch 3X30" Supine:  Quad set AROM 10X5"  SAQ AROM 10X5"  Heelslides AROM 10X  SLR with QS 10X AROM  Bridge AROM 10X      01/08/22 Quad set Glute set Ankle pump SLR Heel slide    PATIENT EDUCATION:  Education details: on evaluation findings, POC and HEP 7/3 HEP Person educated: Patient Education method: Explanation Education comprehension: verbalized understanding   HOME EXERCISE PROGRAM: EVAL: Reviewed post op handout, same as above 7/3 Access Code: [email or web address] - Supine Heel Slide with Strap  - 3 x daily - 7 x weekly - 10 reps - 10 second hold - Squat with Counter Support  - 1 x daily - 7 x weekly - 2 sets - 10 reps - Seated Long Arc Quad (Mirrored)  - 1 x daily - 7 x weekly - 10 reps - 5-10 second hold  ASSESSMENT:  CLINICAL IMPRESSION: Patient demonstrating slight improvement in ROM after bike today and demos full extension. Flexion improves to 108 with heel slides. She demonstrates good mechanics with squats but requires bilateral UE support. Educated on possible reasons for cramping at night and ways to improve. Patient will continue to benefit from physical therapy in order to improve function and reduce impairment.   OBJECTIVE IMPAIRMENTS Abnormal gait, decreased activity tolerance,  decreased balance, decreased mobility, difficulty walking, decreased ROM, decreased strength, hypomobility, increased edema, increased fascial restrictions, impaired flexibility, improper body mechanics, and pain.   ACTIVITY LIMITATIONS lifting, bending, sitting, standing, squatting, sleeping, stairs, transfers, and locomotion level  PARTICIPATION LIMITATIONS: meal prep, cleaning, laundry, driving, shopping, community activity, occupation, and yard work  PERSONAL FACTORS  None  are also affecting patient's functional outcome.  REHAB POTENTIAL: Good  CLINICAL DECISION MAKING: Stable/uncomplicated  EVALUATION COMPLEXITY: Low   GOALS: SHORT TERM GOALS: Target date: 02/05/2022  Patient will be independent with initial HEP and self-management strategies to improve functional outcomes Baseline:  Goal status: IN PROGRESS   LONG TERM GOALS: Target date: 02/19/2022  Patient will be independent with advanced HEP and self-management strategies to improve functional outcomes Baseline:  Goal status: IN PROGRESS  2.  Patient will improve FOTO score to predicted value to indicate improvement in functional outcomes Baseline: 44% function Goal status: IN PROGRESS  3.  Patient will have LT knee AROM 0-120 degrees to improve functional mobility and facilitate squatting to pick up items from floor. Baseline: -4 to 88 degrees Goal status: IN PROGRESS  4. Patient will have equal to or > 4+/5 MMT throughout  to improve ability to perform functional mobility, stair ambulation and ADLs.  Baseline: See MMT Goal status: IN PROGRESS    5. Patient will be able to ambulate at least 350 feet during 2MWT with LRAD to demonstrate improved ability to perform functional mobility and associated tasks. Baseline: 280 feet no AD Goal status: IN PROGRESS PLAN: PT FREQUENCY: 2x/week  PT DURATION: 6 weeks  PLANNED INTERVENTIONS: Therapeutic exercises, Therapeutic activity, Neuromuscular re-education, Balance  training, Gait training, Patient/Family education, Joint manipulation, Joint mobilization, Stair training, Aquatic Therapy, Dry Needling, Electrical stimulation, Spinal manipulation, Spinal mobilization, Cryotherapy, Moist heat, scar mobilization, Taping, Traction, Ultrasound, Biofeedback, Ionotophoresis '4mg'$ /ml Dexamethasone, and Manual therapy.   PLAN FOR NEXT SESSION: Progress knee strength and AROM. Add gait and balance when able   11:17 AM, 01/15/22 Mearl Latin PT, DPT Physical Therapist at [facility]

## 2022-01-18 ENCOUNTER — Ambulatory Visit (HOSPITAL_COMMUNITY): Payer: Medicare Other | Admitting: Physical Therapy

## 2022-01-18 ENCOUNTER — Encounter (HOSPITAL_COMMUNITY): Payer: Self-pay | Admitting: Physical Therapy

## 2022-01-18 DIAGNOSIS — R2689 Other abnormalities of gait and mobility: Secondary | ICD-10-CM

## 2022-01-18 DIAGNOSIS — M25562 Pain in left knee: Secondary | ICD-10-CM

## 2022-01-18 DIAGNOSIS — M25662 Stiffness of left knee, not elsewhere classified: Secondary | ICD-10-CM

## 2022-01-18 NOTE — Therapy (Signed)
OUTPATIENT PHYSICAL THERAPY TREATMENT   Patient Name: Rachel Wood MRN: [id number] DOB:12/25/1950, 71 y.o., female Today's Date: 01/18/2022   PT End of Session - 01/18/22 0734     Visit Number 4    Number of Visits 12    Date for PT Re-Evaluation 02/19/22    Authorization Type [employer] Medicare (no auth, no VL)    Progress Note Due on Visit 10    PT Start Time 0734    PT Stop Time 0812    PT Time Calculation (min) 38 min    Activity Tolerance Patient tolerated treatment well    Behavior During Therapy [facility] for tasks assessed/performed              Past Medical History:  Diagnosis Date   Arthritis    Cancer ([place]) 2004   Breast Cancer   Constipation    Depression    GERD (gastroesophageal reflux disease)    History of kidney stones    Hypercholesteremia    Hypertension    Osteoporosis    Personal history of chemotherapy 2004   Personal history of radiation therapy 2004   PONV (postoperative nausea and vomiting)    Stroke [facility])    While on Tamoxifen   Past Surgical History:  Procedure Laterality Date   CHOLECYSTECTOMY     COLONOSCOPY  12/24/2011   Procedure: COLONOSCOPY;  Surgeon: Danie Binder, MD;  [facility];  Service: Endoscopy;  Laterality: N/A;  8:30 AM   Left breast lumpectomy  2004   LYMPH GLAND EXCISION     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   Procedure: TOTAL KNEE ARTHROPLASTY;  Surgeon: Willaim Sheng, MD;  Location: [facility];  Service: Orthopedics;  Laterality: Left;   TRACHEOSTOMY     as a child   Patient Active Problem List   Diagnosis Date Noted   Gastroesophageal reflux disease without esophagitis 05/01/2018   Hypertension 03/07/2016   History of melanoma 12/20/2014   Hyperlipidemia 12/20/2014    PCP: Anastasia Pall MD   REFERRING PROVIDER: Willaim Sheng, MD   REFERRING DIAG: Post OP left knee Replacement   THERAPY DIAG:  Left knee pain, unspecified chronicity  Stiffness of left knee, not elsewhere  classified  Other abnormalities of gait and mobility  Rationale for Evaluation and Treatment Rehabilitation  ONSET DATE: DOS: 12/27/21  SUBJECTIVE:   SUBJECTIVE STATEMENT: Patient not feeling well this morning, feels like her blood sugar may be low. Didn't eat breakfast but did take her meds.  PERTINENT HISTORY: Lt TKA 12/27/21; RT TKA 2022  PAIN:  Are you having pain? Yes: NPRS scale: 3/10 Pain location: Lt knee Pain description: sore,tightness, aching   Aggravating factors: Walking, WB Relieving factors: rest , meds   PRECAUTIONS: None  WEIGHT BEARING RESTRICTIONS No  FALLS:  Has patient fallen in last 6 months? No  LIVING ENVIRONMENT: Lives with: lives with their spouse Lives in: House/apartment Stairs: Yes: External: 1 steps; none Has following equipment at home: Environmental consultant - 2 wheeled  OCCUPATION: Fish farm manager   PLOF: Independent  PATIENT GOALS  Be able to work in my yard and be on my feet without pain   OBJECTIVE:   DIAGNOSTIC FINDINGS: NA  PATIENT SURVEYS:  FOTO 44% function   COGNITION:  Overall cognitive status: Within functional limits for tasks assessed     SENSATION: WFL  EDEMA:  Minimal   PALPATION: Min TTP   LOWER EXTREMITY ROM:  Active ROM (degrees) Right eval Left eval Left  01/10/22 Left 01/15/22 Left 01/18/22  Hip flexion       Hip extension       Hip abduction       Hip adduction       Hip internal rotation       Hip external rotation       Knee flexion  88 100 101 improves to 108 with slides 102 improves to 109 with heel slides  Knee extension  -4 -3 0   Ankle dorsiflexion       Ankle plantarflexion       Ankle inversion       Ankle eversion        (Blank rows = not tested)  LOWER EXTREMITY MMT:  MMT Right eval Left eval  Hip flexion 5 4  Hip extension    Hip abduction    Hip adduction    Hip internal rotation    Hip external rotation    Knee flexion 5 4  Knee extension 5 4  Ankle dorsiflexion 5 4  Ankle  plantarflexion    Ankle inversion    Ankle eversion     (Blank rows = not tested)   FUNCTIONAL TESTS:  2 minute walk test: 280 feet with no AD  GAIT: Distance walked: 280 feet Assistive device utilized: None Level of assistance: SBA Comments: 2MWT    TODAY'S TREATMENT: 01/18/22 Bike seat 10 rocking for dynamic warm up and holds at end range flexion 5 minutes Heel slides 10x 10 second holds with strap SLR with quad set 3 x 10  HR on incline slope 1x 20 Slant board stretch 3x 20 second holds Step up 6 inch step 2x 10  Lateral step up 6 inch 2x 10  Mini squat 2x 10 with bilateral UE support TKE 2 plates 10x 10 second holds  01/15/22 Bike rocking for dynamic warm up and holds at end range flexion 5 minutes SLR with quad set 2x 10  Heel slides 10 x 10 second holds Hamstring isometric in supine with red ball 10 x 10 second holds HR 1x 20 Hamstring curls 1x 15 LLE Mini squat 2x 10 with bilateral UE support Slant board stretch 3X30" LAQ 10 x 5 second holds Step up 4 inch 2x 10    01/10/22 Bike seat 8 rocking 4 minutes AAROM Standing: heelraises on incline 10X AROM  Knee flexion stretch with 12" step 10X10" AAROM  Lt knee flexion 10X AROM  Slant board stretch 3X30" Supine:  Quad set AROM 10X5"  SAQ AROM 10X5"  Heelslides AROM 10X  SLR with QS 10X AROM  Bridge AROM 10X      01/08/22 Quad set Glute set Ankle pump SLR Heel slide    PATIENT EDUCATION:  Education details: on evaluation findings, POC and HEP 7/3 HEP Person educated: Patient Education method: Explanation Education comprehension: verbalized understanding   HOME EXERCISE PROGRAM: EVAL: Reviewed post op handout, same as above 7/3 Access Code: [email or web address] - Supine Heel Slide with Strap  - 3 x daily - 7 x weekly - 10 reps - 10 second hold - Squat with Counter Support  - 1 x daily - 7 x weekly - 2 sets - 10 reps - Seated Long Arc Quad (Mirrored)  - 1 x daily - 7 x weekly - 10 reps - 5-10 second  hold  ASSESSMENT:  CLINICAL IMPRESSION: Patient arrives not feeling well stating possibly low blood sugar, she is given apple juice with improvement following. Patient demonstrating improving symptoms after bike  today and demos full extension. Flexion improves to 109 with heel slides. She demonstrates improving strength with ability to complete step up on increased height step. States minimal to moderate fatigue at end of session. Overall, progressing well. Patient will continue to benefit from physical therapy in order to improve function and reduce impairment.   OBJECTIVE IMPAIRMENTS Abnormal gait, decreased activity tolerance, decreased balance, decreased mobility, difficulty walking, decreased ROM, decreased strength, hypomobility, increased edema, increased fascial restrictions, impaired flexibility, improper body mechanics, and pain.   ACTIVITY LIMITATIONS lifting, bending, sitting, standing, squatting, sleeping, stairs, transfers, and locomotion level  PARTICIPATION LIMITATIONS: meal prep, cleaning, laundry, driving, shopping, community activity, occupation, and yard work  PERSONAL FACTORS  None  are also affecting patient's functional outcome.   REHAB POTENTIAL: Good  CLINICAL DECISION MAKING: Stable/uncomplicated  EVALUATION COMPLEXITY: Low   GOALS: SHORT TERM GOALS: Target date: 02/05/2022  Patient will be independent with initial HEP and self-management strategies to improve functional outcomes Baseline:  Goal status: IN PROGRESS   LONG TERM GOALS: Target date: 02/19/2022  Patient will be independent with advanced HEP and self-management strategies to improve functional outcomes Baseline:  Goal status: IN PROGRESS  2.  Patient will improve FOTO score to predicted value to indicate improvement in functional outcomes Baseline: 44% function Goal status: IN PROGRESS  3.  Patient will have LT knee AROM 0-120 degrees to improve functional mobility and facilitate squatting to  pick up items from floor. Baseline: -4 to 88 degrees Goal status: IN PROGRESS  4. Patient will have equal to or > 4+/5 MMT throughout  to improve ability to perform functional mobility, stair ambulation and ADLs.  Baseline: See MMT Goal status: IN PROGRESS    5. Patient will be able to ambulate at least 350 feet during 2MWT with LRAD to demonstrate improved ability to perform functional mobility and associated tasks. Baseline: 280 feet no AD Goal status: IN PROGRESS PLAN: PT FREQUENCY: 2x/week  PT DURATION: 6 weeks  PLANNED INTERVENTIONS: Therapeutic exercises, Therapeutic activity, Neuromuscular re-education, Balance training, Gait training, Patient/Family education, Joint manipulation, Joint mobilization, Stair training, Aquatic Therapy, Dry Needling, Electrical stimulation, Spinal manipulation, Spinal mobilization, Cryotherapy, Moist heat, scar mobilization, Taping, Traction, Ultrasound, Biofeedback, Ionotophoresis '4mg'$ /ml Dexamethasone, and Manual therapy.   PLAN FOR NEXT SESSION: Progress knee strength and AROM. Add gait and balance when able   7:35 AM, 01/18/22 Mearl Latin PT, DPT Physical Therapist at [facility]

## 2022-01-23 ENCOUNTER — Ambulatory Visit (HOSPITAL_COMMUNITY): Payer: Medicare Other | Admitting: Physical Therapy

## 2022-01-23 DIAGNOSIS — M25562 Pain in left knee: Secondary | ICD-10-CM | POA: Diagnosis not present

## 2022-01-23 DIAGNOSIS — M25662 Stiffness of left knee, not elsewhere classified: Secondary | ICD-10-CM

## 2022-01-23 DIAGNOSIS — R2689 Other abnormalities of gait and mobility: Secondary | ICD-10-CM

## 2022-01-23 NOTE — Therapy (Signed)
OUTPATIENT PHYSICAL THERAPY TREATMENT   Patient Name: Rachel Wood MRN: [id number] DOB:08-07-1950, 71 y.o., female Today's Date: 01/23/2022   PT End of Session - 01/23/22 1552     Visit Number 5    Number of Visits 12    Date for PT Re-Evaluation 02/19/22    Authorization Type [employer] Medicare (no auth, no VL)    Progress Note Due on Visit 10    PT Start Time 5284    PT Stop Time 1324    PT Time Calculation (min) 40 min    Activity Tolerance Patient tolerated treatment well    Behavior During Therapy [facility] for tasks assessed/performed              Past Medical History:  Diagnosis Date   Arthritis    Cancer ([place]) 2004   Breast Cancer   Constipation    Depression    GERD (gastroesophageal reflux disease)    History of kidney stones    Hypercholesteremia    Hypertension    Osteoporosis    Personal history of chemotherapy 2004   Personal history of radiation therapy 2004   PONV (postoperative nausea and vomiting)    Stroke [facility])    While on Tamoxifen   Past Surgical History:  Procedure Laterality Date   CHOLECYSTECTOMY     COLONOSCOPY  12/24/2011   Procedure: COLONOSCOPY;  Surgeon: Danie Binder, MD;  [facility];  Service: Endoscopy;  Laterality: N/A;  8:30 AM   Left breast lumpectomy  2004   LYMPH GLAND EXCISION     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   Procedure: TOTAL KNEE ARTHROPLASTY;  Surgeon: Willaim Sheng, MD;  Location: [facility];  Service: Orthopedics;  Laterality: Left;   TRACHEOSTOMY     as a child   Patient Active Problem List   Diagnosis Date Noted   Gastroesophageal reflux disease without esophagitis 05/01/2018   Hypertension 03/07/2016   History of melanoma 12/20/2014   Hyperlipidemia 12/20/2014    PCP: Anastasia Pall MD   REFERRING PROVIDER: Willaim Sheng, MD   REFERRING DIAG: Post OP left knee Replacement   THERAPY DIAG:  Left knee pain, unspecified chronicity  Stiffness of left knee, not elsewhere  classified  Other abnormalities of gait and mobility  Rationale for Evaluation and Treatment Rehabilitation  ONSET DATE: DOS: 12/27/21  SUBJECTIVE:   SUBJECTIVE STATEMENT: Patient states she is now sleeping better, the swelling is better and it's not cramping like it was.  No pain currently just tightness.  States she is having stomach issues that are more bothersome at the moment.  PERTINENT HISTORY: Lt TKA 12/27/21; RT TKA 2022  PAIN:  Are you having pain? No  PRECAUTIONS: None  WEIGHT BEARING RESTRICTIONS No  FALLS:  Has patient fallen in last 6 months? No  LIVING ENVIRONMENT: Lives with: lives with their spouse Lives in: House/apartment Stairs: Yes: External: 1 steps; none Has following equipment at home: Environmental consultant - 2 wheeled  OCCUPATION: Fish farm manager   PLOF: Independent  PATIENT GOALS  Be able to work in my yard and be on my feet without pain   OBJECTIVE:   DIAGNOSTIC FINDINGS: NA  PATIENT SURVEYS:  FOTO 44% function   COGNITION:  Overall cognitive status: Within functional limits for tasks assessed     SENSATION: WFL  EDEMA:  Minimal   PALPATION: Min TTP   LOWER EXTREMITY ROM:  Active ROM (degrees) Right eval Left eval Left 01/10/22 Left 01/15/22 Left 01/18/22 Left 01/23/22  Hip flexion        Hip extension        Hip abduction        Hip adduction        Hip internal rotation        Hip external rotation        Knee flexion  88 100 101 improves to 108 with slides 102 improves to 109 with heel slides AROM 105 AAROM 110  Knee extension  -4 -3 0    Ankle dorsiflexion        Ankle plantarflexion        Ankle inversion        Ankle eversion         (Blank rows = not tested)  LOWER EXTREMITY MMT:  MMT Right eval Left eval  Hip flexion 5 4  Hip extension    Hip abduction    Hip adduction    Hip internal rotation    Hip external rotation    Knee flexion 5 4  Knee extension 5 4  Ankle dorsiflexion 5 4  Ankle plantarflexion    Ankle  inversion    Ankle eversion     (Blank rows = not tested)   FUNCTIONAL TESTS:  2 minute walk test: 280 feet with no AD  GAIT: Distance walked: 280 feet Assistive device utilized: None Level of assistance: SBA Comments: 2MWT    TODAY'S TREATMENT: 01/23/22 Bike seat 10 rocking Standing:  12" box knee flexion stretch 10X10"  Lt knee flexion  Slant board stretch 3x 20 second holds  lateral step up 4" 10X with 1 HHA  Forward step up 4"10X  Supine:  Lt SLR 10X  Lt heelslides 2X10 Manual:  STM and scar massage prior to AROM/AAROM Knee flexion 105/110 following    01/18/22 Bike seat 10 rocking for dynamic warm up and holds at end range flexion 5 minutes Heel slides 10x 10 second holds with strap SLR with quad set 3 x 10  HR on incline slope 1x 20 Slant board stretch 3x 20 second holds Step up 6 inch step 2x 10  Lateral step up 6 inch 2x 10  Mini squat 2x 10 with bilateral UE support TKE 2 plates 10x 10 second holds  01/15/22 Bike rocking for dynamic warm up and holds at end range flexion 5 minutes SLR with quad set 2x 10  Heel slides 10 x 10 second holds Hamstring isometric in supine with red ball 10 x 10 second holds HR 1x 20 Hamstring curls 1x 15 LLE Mini squat 2x 10 with bilateral UE support Slant board stretch 3X30" LAQ 10 x 5 second holds Step up 4 inch 2x 10    01/10/22 Bike seat 8 rocking 4 minutes AAROM Standing: heelraises on incline 10X AROM  Knee flexion stretch with 12" step 10X10" AAROM  Lt knee flexion 10X AROM  Slant board stretch 3X30" Supine:  Quad set AROM 10X5"  SAQ AROM 10X5"  Heelslides AROM 10X  SLR with QS 10X AROM  Bridge AROM 10X      01/08/22 Quad set Glute set Ankle pump SLR Heel slide    PATIENT EDUCATION:  Education details: on evaluation findings, POC and HEP 7/3 HEP Person educated: Patient Education method: Explanation Education comprehension: verbalized understanding   HOME EXERCISE PROGRAM: EVAL: Reviewed post op  handout, same as above 7/3 Access Code: [email or web address] - Supine Heel Slide with Strap  - 3 x daily - 7 x weekly - 10 reps -  10 second hold - Squat with Counter Support  - 1 x daily - 7 x weekly - 2 sets - 10 reps - Seated Long Arc Quad (Mirrored)  - 1 x daily - 7 x weekly - 10 reps - 5-10 second hold  ASSESSMENT:  CLINICAL IMPRESSION: Continued with focus on improving Lt knee ROM and functional strength.  Began with bike and stretches to promote increased flexion.  Pt with discomfort and noted difficulty completing step ups to 6" and max use of UE to complete.  Decreased to 4" step with ability to complete with 1 UE assist and no discomfort. Manual completed to soft tissue perimeter of incision and scar massage to reduce tightness, improve flexion.  Pt with resultant increase in AROM following.  Pt reported much improvement following manual.  Encouraged to wear compression stocking during the day to help reduce edema and tightness to perimeter as this will improve mobility.  Patient will continue to benefit from physical therapy in order to improve function and reduce impairment.   OBJECTIVE IMPAIRMENTS Abnormal gait, decreased activity tolerance, decreased balance, decreased mobility, difficulty walking, decreased ROM, decreased strength, hypomobility, increased edema, increased fascial restrictions, impaired flexibility, improper body mechanics, and pain.   ACTIVITY LIMITATIONS lifting, bending, sitting, standing, squatting, sleeping, stairs, transfers, and locomotion level  PARTICIPATION LIMITATIONS: meal prep, cleaning, laundry, driving, shopping, community activity, occupation, and yard work  PERSONAL FACTORS  None  are also affecting patient's functional outcome.   REHAB POTENTIAL: Good  CLINICAL DECISION MAKING: Stable/uncomplicated  EVALUATION COMPLEXITY: Low   GOALS: SHORT TERM GOALS: Target date: 02/05/2022  Patient will be independent with initial HEP and self-management strategies  to improve functional outcomes Baseline:  Goal status: IN PROGRESS   LONG TERM GOALS: Target date: 02/19/2022  Patient will be independent with advanced HEP and self-management strategies to improve functional outcomes Baseline:  Goal status: IN PROGRESS  2.  Patient will improve FOTO score to predicted value to indicate improvement in functional outcomes Baseline: 44% function Goal status: IN PROGRESS  3.  Patient will have LT knee AROM 0-120 degrees to improve functional mobility and facilitate squatting to pick up items from floor. Baseline: -4 to 88 degrees Goal status: IN PROGRESS  4. Patient will have equal to or > 4+/5 MMT throughout  to improve ability to perform functional mobility, stair ambulation and ADLs.  Baseline: See MMT Goal status: IN PROGRESS    5. Patient will be able to ambulate at least 350 feet during 2MWT with LRAD to demonstrate improved ability to perform functional mobility and associated tasks. Baseline: 280 feet no AD Goal status: IN PROGRESS PLAN: PT FREQUENCY: 2x/week  PT DURATION: 6 weeks  PLANNED INTERVENTIONS: Therapeutic exercises, Therapeutic activity, Neuromuscular re-education, Balance training, Gait training, Patient/Family education, Joint manipulation, Joint mobilization, Stair training, Aquatic Therapy, Dry Needling, Electrical stimulation, Spinal manipulation, Spinal mobilization, Cryotherapy, Moist heat, scar mobilization, Taping, Traction, Ultrasound, Biofeedback, Ionotophoresis '4mg'$ /ml Dexamethasone, and Manual therapy.   PLAN FOR NEXT SESSION: Progress knee strength and AROM. Add gait and balance when able   3:53 PM, 01/23/22 Teena Irani, PTA/CLT [place] Ph: [phone]

## 2022-01-25 ENCOUNTER — Ambulatory Visit (HOSPITAL_COMMUNITY): Payer: Medicare Other | Admitting: Physical Therapy

## 2022-01-25 DIAGNOSIS — M25662 Stiffness of left knee, not elsewhere classified: Secondary | ICD-10-CM

## 2022-01-25 DIAGNOSIS — M25562 Pain in left knee: Secondary | ICD-10-CM | POA: Diagnosis not present

## 2022-01-25 DIAGNOSIS — R2689 Other abnormalities of gait and mobility: Secondary | ICD-10-CM

## 2022-01-25 NOTE — Therapy (Signed)
OUTPATIENT PHYSICAL THERAPY TREATMENT   Patient Name: Rachel Wood MRN: [id number] DOB:[birth year], 71 y.o., female Today's Date: 01/25/2022   PT End of Session - 01/25/22 1306     Visit Number 6    Number of Visits 12    Date for PT Re-Evaluation 02/19/22    Authorization Type [employer] Medicare (no auth, no VL)    Progress Note Due on Visit 10    PT Start Time 2671    PT Stop Time 1343    PT Time Calculation (min) 38 min    Activity Tolerance Patient tolerated treatment well    Behavior During Therapy [facility] for tasks assessed/performed              Past Medical History:  Diagnosis Date   Arthritis    Cancer ([place]) 2004   Breast Cancer   Constipation    Depression    GERD (gastroesophageal reflux disease)    History of kidney stones    Hypercholesteremia    Hypertension    Osteoporosis    Personal history of chemotherapy 2004   Personal history of radiation therapy 2004   PONV (postoperative nausea and vomiting)    Stroke [facility])    While on Tamoxifen   Past Surgical History:  Procedure Laterality Date   CHOLECYSTECTOMY     COLONOSCOPY  12/24/2011   Procedure: COLONOSCOPY;  Surgeon: Danie Binder, MD;  [facility];  Service: Endoscopy;  Laterality: N/A;  8:30 AM   Left breast lumpectomy  2004   LYMPH GLAND EXCISION     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   Procedure: TOTAL KNEE ARTHROPLASTY;  Surgeon: Willaim Sheng, MD;  Location: [facility];  Service: Orthopedics;  Laterality: Left;   TRACHEOSTOMY     as a child   Patient Active Problem List   Diagnosis Date Noted   Gastroesophageal reflux disease without esophagitis 05/01/2018   Hypertension 03/07/2016   History of melanoma 12/20/2014   Hyperlipidemia 12/20/2014    PCP: Anastasia Pall MD   REFERRING PROVIDER: Willaim Sheng, MD   REFERRING DIAG: Post OP left knee Replacement   THERAPY DIAG:  No diagnosis found.  Rationale for Evaluation and Treatment Rehabilitation  ONSET  DATE: DOS: 12/27/21  SUBJECTIVE:   SUBJECTIVE STATEMENT: Patient reports ongoing swelling. Pain mostly from swelling and tightness. Night time is still tough. She is wearing compression socks now.   PERTINENT HISTORY: Lt TKA 12/27/21; RT TKA 2022  PAIN:  Are you having pain? Yes: NPRS scale: 3/10 Pain location: LT knee Pain description: tight, stiff, sore Aggravating factors: WB, standing, prolonged position Relieving factors: stretching, socks, movement  PRECAUTIONS: None  WEIGHT BEARING RESTRICTIONS No  FALLS:  Has patient fallen in last 6 months? No  LIVING ENVIRONMENT: Lives with: lives with their spouse Lives in: House/apartment Stairs: Yes: External: 1 steps; none Has following equipment at home: Environmental consultant - 2 wheeled  OCCUPATION: Fish farm manager   PLOF: Independent  PATIENT GOALS  Be able to work in my yard and be on my feet without pain   OBJECTIVE:   DIAGNOSTIC FINDINGS: NA  PATIENT SURVEYS:  FOTO 44% function   COGNITION:  Overall cognitive status: Within functional limits for tasks assessed     SENSATION: WFL  EDEMA:  Minimal   PALPATION: Min TTP   LOWER EXTREMITY ROM:  Active ROM (degrees) Right eval Left eval Left 01/10/22 Left 01/15/22 Left 01/18/22 Left 01/23/22  Hip flexion  Hip extension        Hip abduction        Hip adduction        Hip internal rotation        Hip external rotation        Knee flexion  88 100 101 improves to 108 with slides 102 improves to 109 with heel slides AROM 105 AAROM 110  Knee extension  -4 -3 0    Ankle dorsiflexion        Ankle plantarflexion        Ankle inversion        Ankle eversion         (Blank rows = not tested)  LOWER EXTREMITY MMT:  MMT Right eval Left eval  Hip flexion 5 4  Hip extension    Hip abduction    Hip adduction    Hip internal rotation    Hip external rotation    Knee flexion 5 4  Knee extension 5 4  Ankle dorsiflexion 5 4  Ankle plantarflexion    Ankle  inversion    Ankle eversion     (Blank rows = not tested)   FUNCTIONAL TESTS:  2 minute walk test: 280 feet with no AD  GAIT: Distance walked: 280 feet Assistive device utilized: None Level of assistance: SBA Comments: 2MWT    TODAY'S TREATMENT: 01/25/22 Nustep (seat 8) 5 min warmup for ROM   Calf stretch 3 x 30"  Heel raise x20 Toe raise x20 Step up 7 inch 2 x 10  Side step up on 7 inch step 2 x 10 HHA x 2  Knee drive (on second step) 10 x 10"  Standing hip abduction 2 x 10 Tandem stance 3 x 30"  TKE 3 plates   Manual PROM knee flexion (got up to 112 deg flexion)   Clinic ambulation 2 RT   LT knee AROM (0-108 deg)  01/23/22 Bike seat 10 rocking Standing:  12" box knee flexion stretch 10X10"  Lt knee flexion  Slant board stretch 3x 20 second holds  lateral step up 4" 10X with 1 HHA  Forward step up 4"10X  Supine:  Lt SLR 10X  Lt heelslides 2X10 Manual:  STM and scar massage prior to AROM/AAROM Knee flexion 105/110 following    01/18/22 Bike seat 10 rocking for dynamic warm up and holds at end range flexion 5 minutes Heel slides 10x 10 second holds with strap SLR with quad set 3 x 10  HR on incline slope 1x 20 Slant board stretch 3x 20 second holds Step up 6 inch step 2x 10  Lateral step up 6 inch 2x 10  Mini squat 2x 10 with bilateral UE support TKE 2 plates 10x 10 second holds  01/15/22 Bike rocking for dynamic warm up and holds at end range flexion 5 minutes SLR with quad set 2x 10  Heel slides 10 x 10 second holds Hamstring isometric in supine with red ball 10 x 10 second holds HR 1x 20 Hamstring curls 1x 15 LLE Mini squat 2x 10 with bilateral UE support Slant board stretch 3X30" LAQ 10 x 5 second holds Step up 4 inch 2x 10    01/10/22 Bike seat 8 rocking 4 minutes AAROM Standing: heelraises on incline 10X AROM  Knee flexion stretch with 12" step 10X10" AAROM  Lt knee flexion 10X AROM  Slant board stretch 3X30" Supine:  Quad set AROM  10X5"  SAQ AROM 10X5"  Heelslides AROM 10X  SLR with QS 10X AROM  Bridge AROM 10X      01/08/22 Quad set Glute set Ankle pump SLR Heel slide    PATIENT EDUCATION:  Education details: on evaluation findings, POC and HEP 7/3 HEP Person educated: Patient Education method: Explanation Education comprehension: verbalized understanding   HOME EXERCISE PROGRAM: EVAL: Reviewed post op handout, same as above 7/3 Access Code: [email or web address] - Supine Heel Slide with Strap  - 3 x daily - 7 x weekly - 10 reps - 10 second hold - Squat with Counter Support  - 1 x daily - 7 x weekly - 2 sets - 10 reps - Seated Long Arc Quad (Mirrored)  - 1 x daily - 7 x weekly - 10 reps - 5-10 second hold  ASSESSMENT:  CLINICAL IMPRESSION: Patient did well today though noting some increased discomfort, especially at end range with stretching. Able to progress LE strengthening to include 7 inch step ups and machine TKE. Patient educated on purpose and function. Able to progress to 112 deg knee flexion with manual AAROM. Patient remains limited by pain and ROM restriction. Patient will continue to benefit from skilled therapy services to reduce remaining deficits and improve functional ability.    OBJECTIVE IMPAIRMENTS Abnormal gait, decreased activity tolerance, decreased balance, decreased mobility, difficulty walking, decreased ROM, decreased strength, hypomobility, increased edema, increased fascial restrictions, impaired flexibility, improper body mechanics, and pain.   ACTIVITY LIMITATIONS lifting, bending, sitting, standing, squatting, sleeping, stairs, transfers, and locomotion level  PARTICIPATION LIMITATIONS: meal prep, cleaning, laundry, driving, shopping, community activity, occupation, and yard work  PERSONAL FACTORS  None  are also affecting patient's functional outcome.   REHAB POTENTIAL: Good  CLINICAL DECISION MAKING: Stable/uncomplicated  EVALUATION COMPLEXITY: Low   GOALS: SHORT TERM  GOALS: Target date: 02/05/2022  Patient will be independent with initial HEP and self-management strategies to improve functional outcomes Baseline:  Goal status: IN PROGRESS   LONG TERM GOALS: Target date: 02/19/2022  Patient will be independent with advanced HEP and self-management strategies to improve functional outcomes Baseline:  Goal status: IN PROGRESS  2.  Patient will improve FOTO score to predicted value to indicate improvement in functional outcomes Baseline: 44% function Goal status: IN PROGRESS  3.  Patient will have LT knee AROM 0-120 degrees to improve functional mobility and facilitate squatting to pick up items from floor. Baseline: -4 to 88 degrees Goal status: IN PROGRESS  4. Patient will have equal to or > 4+/5 MMT throughout  to improve ability to perform functional mobility, stair ambulation and ADLs.  Baseline: See MMT Goal status: IN PROGRESS    5. Patient will be able to ambulate at least 350 feet during 2MWT with LRAD to demonstrate improved ability to perform functional mobility and associated tasks. Baseline: 280 feet no AD Goal status: IN PROGRESS PLAN: PT FREQUENCY: 2x/week  PT DURATION: 6 weeks  PLANNED INTERVENTIONS: Therapeutic exercises, Therapeutic activity, Neuromuscular re-education, Balance training, Gait training, Patient/Family education, Joint manipulation, Joint mobilization, Stair training, Aquatic Therapy, Dry Needling, Electrical stimulation, Spinal manipulation, Spinal mobilization, Cryotherapy, Moist heat, scar mobilization, Taping, Traction, Ultrasound, Biofeedback, Ionotophoresis '4mg'$ /ml Dexamethasone, and Manual therapy.   PLAN FOR NEXT SESSION: Progress knee strength and AROM. Stairs and balance   1:08 PM, 01/25/22 Josue Hector PT DPT  Physical Therapist with [facility]  [phone]

## 2022-01-26 ENCOUNTER — Ambulatory Visit (HOSPITAL_COMMUNITY): Payer: Medicare Other | Admitting: Physical Therapy

## 2022-01-30 ENCOUNTER — Ambulatory Visit (HOSPITAL_COMMUNITY): Payer: Medicare Other | Admitting: Physical Therapy

## 2022-01-30 ENCOUNTER — Encounter (HOSPITAL_COMMUNITY): Payer: Medicare Other

## 2022-01-30 DIAGNOSIS — R2689 Other abnormalities of gait and mobility: Secondary | ICD-10-CM

## 2022-01-30 DIAGNOSIS — M25662 Stiffness of left knee, not elsewhere classified: Secondary | ICD-10-CM

## 2022-01-30 DIAGNOSIS — M25562 Pain in left knee: Secondary | ICD-10-CM

## 2022-01-30 NOTE — Therapy (Signed)
OUTPATIENT PHYSICAL THERAPY TREATMENT   Patient Name: Rachel Wood MRN: [id number] DOB:[birth year], 71 y.o., female Today's Date: 01/30/2022   PT End of Session - 01/30/22 1040     Visit Number 7    Number of Visits 12    Date for PT Re-Evaluation 02/19/22    Authorization Type UHC Medicare (no auth, no VL)    Progress Note Due on Visit 10    PT Start Time 1033    PT Stop Time 1114    PT Time Calculation (min) 41 min    Activity Tolerance Patient tolerated treatment well    Behavior During Therapy [facility] for tasks assessed/performed              Past Medical History:  Diagnosis Date   Arthritis    Cancer ([place]) 2004   Breast Cancer   Constipation    Depression    GERD (gastroesophageal reflux disease)    History of kidney stones    Hypercholesteremia    Hypertension    Osteoporosis    Personal history of chemotherapy 2004   Personal history of radiation therapy 2004   PONV (postoperative nausea and vomiting)    Stroke [facility])    While on Tamoxifen   Past Surgical History:  Procedure Laterality Date   CHOLECYSTECTOMY     COLONOSCOPY  12/24/2011   Procedure: COLONOSCOPY;  Surgeon: Danie Binder, MD;  [facility];  Service: Endoscopy;  Laterality: N/A;  8:30 AM   Left breast lumpectomy  2004   LYMPH GLAND EXCISION     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   Procedure: TOTAL KNEE ARTHROPLASTY;  Surgeon: Willaim Sheng, MD;  Location: [facility];  Service: Orthopedics;  Laterality: Left;   TRACHEOSTOMY     as a child   Patient Active Problem List   Diagnosis Date Noted   Gastroesophageal reflux disease without esophagitis 05/01/2018   Hypertension 03/07/2016   History of melanoma 12/20/2014   Hyperlipidemia 12/20/2014    PCP: Anastasia Pall MD   REFERRING PROVIDER: Willaim Sheng, MD   REFERRING DIAG: Post OP left knee Replacement   THERAPY DIAG:  Left knee pain, unspecified chronicity  Stiffness of left knee, not elsewhere  classified  Other abnormalities of gait and mobility  Rationale for Evaluation and Treatment Rehabilitation  ONSET DATE: DOS: 12/27/21  SUBJECTIVE:   SUBJECTIVE STATEMENT: Patient reports wearing the compression sock has helped a lot with her ROM and swelling. Night time is still rough trying to get to sleep due to the cramps. States she bought some OTC foam that has helped but admits to not drinking enough water.   PERTINENT HISTORY: Lt TKA 12/27/21; RT TKA 2022  PAIN:  Are you having pain? Yes: NPRS scale: 3/10 Pain location: LT knee Pain description: tight, stiff, sore Aggravating factors: WB, standing, prolonged position Relieving factors: stretching, socks, movement  PRECAUTIONS: None  WEIGHT BEARING RESTRICTIONS No  FALLS:  Has patient fallen in last 6 months? No  LIVING ENVIRONMENT: Lives with: lives with their spouse Lives in: House/apartment Stairs: Yes: External: 1 steps; none Has following equipment at home: Environmental consultant - 2 wheeled  OCCUPATION: Fish farm manager   PLOF: Independent  PATIENT GOALS  Be able to work in my yard and be on my feet without pain   OBJECTIVE:   DIAGNOSTIC FINDINGS: NA  PATIENT SURVEYS:  FOTO 44% function   COGNITION:  Overall cognitive status: Within functional limits for tasks assessed     SENSATION:  WFL  EDEMA:  Minimal   PALPATION: Min TTP   LOWER EXTREMITY ROM:  Active ROM (degrees) Right eval Left eval Left 01/10/22 Left 01/15/22 Left 01/18/22 Left 01/23/22  Hip flexion        Hip extension        Hip abduction        Hip adduction        Hip internal rotation        Hip external rotation        Knee flexion  88 100 101 improves to 108 with slides 102 improves to 109 with heel slides AROM 105 AAROM 110  Knee extension  -4 -3 0    Ankle dorsiflexion        Ankle plantarflexion        Ankle inversion        Ankle eversion         (Blank rows = not tested)  LOWER EXTREMITY MMT:  MMT Right eval Left eval   Hip flexion 5 4  Hip extension    Hip abduction    Hip adduction    Hip internal rotation    Hip external rotation    Knee flexion 5 4  Knee extension 5 4  Ankle dorsiflexion 5 4  Ankle plantarflexion    Ankle inversion    Ankle eversion     (Blank rows = not tested)   FUNCTIONAL TESTS:  2 minute walk test: 280 feet with no AD  GAIT: Distance walked: 280 feet Assistive device utilized: None Level of assistance: SBA Comments: 2MWT    TODAY'S TREATMENT: 01/30/22 Bike seat 10 rocking 4 minutes Standing:  12" Lt knee flexion stretch 10X10"  Slant board stretch 3X30"  Heeraise/toeraises 20X each  lateral step up 6" 10X with 1 HHA  Forward step up 6"10X   [employer] TKE 4Pl 15X5" holds Manual STM and PROM knee flexion (117) Lt knee AROM  0-110   01/25/22 Nustep (seat 8) 5 min warmup for ROM   Calf stretch 3 x 30"  Heel raise x20 Toe raise x20 Step up 7 inch 2 x 10  Side step up on 7 inch step 2 x 10 HHA x 2  Knee drive (on second step) 10 x 10"  Standing hip abduction 2 x 10 Tandem stance 3 x 30"  TKE 3 plates   Manual PROM knee flexion (got up to 112 deg flexion)   Clinic ambulation 2 RT   LT knee AROM (0-108 deg)  01/23/22 Bike seat 10 rocking Standing:  12" box knee flexion stretch 10X10"  Lt knee flexion  Slant board stretch 3x 20 second holds  lateral step up 4" 10X with 1 HHA  Forward step up 4"10X  Supine:  Lt SLR 10X  Lt heelslides 2X10 Manual:  STM and scar massage prior to AROM/AAROM Knee flexion 105/110 following    01/18/22 Bike seat 10 rocking for dynamic warm up and holds at end range flexion 5 minutes Heel slides 10x 10 second holds with strap SLR with quad set 3 x 10  HR on incline slope 1x 20 Slant board stretch 3x 20 second holds Step up 6 inch step 2x 10  Lateral step up 6 inch 2x 10  Mini squat 2x 10 with bilateral UE support TKE 2 plates 10x 10 second holds  01/15/22 Bike rocking for dynamic warm up and holds at end range  flexion 5 minutes SLR with quad set 2x 10  Heel slides 10  x 10 second holds Hamstring isometric in supine with red ball 10 x 10 second holds HR 1x 20 Hamstring curls 1x 15 LLE Mini squat 2x 10 with bilateral UE support Slant board stretch 3X30" LAQ 10 x 5 second holds Step up 4 inch 2x 10    01/10/22 Bike seat 8 rocking 4 minutes AAROM Standing: heelraises on incline 10X AROM  Knee flexion stretch with 12" step 10X10" AAROM  Lt knee flexion 10X AROM  Slant board stretch 3X30" Supine:  Quad set AROM 10X5"  SAQ AROM 10X5"  Heelslides AROM 10X  SLR with QS 10X AROM  Bridge AROM 10X      01/08/22 Quad set Glute set Ankle pump SLR Heel slide    PATIENT EDUCATION:  Education details: on evaluation findings, POC and HEP 7/3 HEP Person educated: Patient Education method: Explanation Education comprehension: verbalized understanding   HOME EXERCISE PROGRAM: EVAL: Reviewed post op handout, same as above 7/3 Access Code: [email or web address] - Supine Heel Slide with Strap  - 3 x daily - 7 x weekly - 10 reps - 10 second hold - Squat with Counter Support  - 1 x daily - 7 x weekly - 2 sets - 10 reps - Seated Long Arc Quad (Mirrored)  - 1 x daily - 7 x weekly - 10 reps - 5-10 second hold  ASSESSMENT:  CLINICAL IMPRESSION: Began on bike with near ability to make full revolutions.  Continued with standing strengthening/stab exericses.  Noted challenge with eccentric lowering of lateral step ups.  Pt able to complete all exercises without issues.  Increased A/PROM today of Lt knee to 110/117.  Encouraged to drink more water, or add electrolytes (liquid IV) to water to ensure she is fully hydrated.  Educated on how hydration prevents muscle cramps.   Patient will continue to benefit from skilled therapy services to reduce remaining deficits and improve functional ability.    OBJECTIVE IMPAIRMENTS Abnormal gait, decreased activity tolerance, decreased balance, decreased mobility, difficulty  walking, decreased ROM, decreased strength, hypomobility, increased edema, increased fascial restrictions, impaired flexibility, improper body mechanics, and pain.   ACTIVITY LIMITATIONS lifting, bending, sitting, standing, squatting, sleeping, stairs, transfers, and locomotion level  PARTICIPATION LIMITATIONS: meal prep, cleaning, laundry, driving, shopping, community activity, occupation, and yard work  PERSONAL FACTORS  None  are also affecting patient's functional outcome.   REHAB POTENTIAL: Good  CLINICAL DECISION MAKING: Stable/uncomplicated  EVALUATION COMPLEXITY: Low   GOALS: SHORT TERM GOALS: Target date: 02/05/2022  Patient will be independent with initial HEP and self-management strategies to improve functional outcomes Baseline:  Goal status: IN PROGRESS   LONG TERM GOALS: Target date: 02/19/2022  Patient will be independent with advanced HEP and self-management strategies to improve functional outcomes Baseline:  Goal status: IN PROGRESS  2.  Patient will improve FOTO score to predicted value to indicate improvement in functional outcomes Baseline: 44% function Goal status: IN PROGRESS  3.  Patient will have LT knee AROM 0-120 degrees to improve functional mobility and facilitate squatting to pick up items from floor. Baseline: -4 to 88 degrees Goal status: IN PROGRESS  4. Patient will have equal to or > 4+/5 MMT throughout  to improve ability to perform functional mobility, stair ambulation and ADLs.  Baseline: See MMT Goal status: IN PROGRESS    5. Patient will be able to ambulate at least 350 feet during 2MWT with LRAD to demonstrate improved ability to perform functional mobility and associated tasks. Baseline: 280 feet no AD Goal status:  IN PROGRESS PLAN: PT FREQUENCY: 2x/week  PT DURATION: 6 weeks  PLANNED INTERVENTIONS: Therapeutic exercises, Therapeutic activity, Neuromuscular re-education, Balance training, Gait training, Patient/Family education,  Joint manipulation, Joint mobilization, Stair training, Aquatic Therapy, Dry Needling, Electrical stimulation, Spinal manipulation, Spinal mobilization, Cryotherapy, Moist heat, scar mobilization, Taping, Traction, Ultrasound, Biofeedback, Ionotophoresis '4mg'$ /ml Dexamethasone, and Manual therapy.   PLAN FOR NEXT SESSION: Progress knee strength and AROM. Stairs and balance   11:57 AM, 01/30/22 Teena Irani, PTA/CLT [place] Ph: [phone]

## 2022-02-01 ENCOUNTER — Encounter (HOSPITAL_COMMUNITY): Payer: Medicare Other

## 2022-02-01 ENCOUNTER — Ambulatory Visit (HOSPITAL_COMMUNITY): Payer: Medicare Other | Admitting: Physical Therapy

## 2022-02-01 ENCOUNTER — Encounter (HOSPITAL_COMMUNITY): Payer: Self-pay | Admitting: Physical Therapy

## 2022-02-01 DIAGNOSIS — M25662 Stiffness of left knee, not elsewhere classified: Secondary | ICD-10-CM

## 2022-02-01 DIAGNOSIS — M25562 Pain in left knee: Secondary | ICD-10-CM

## 2022-02-01 DIAGNOSIS — R2689 Other abnormalities of gait and mobility: Secondary | ICD-10-CM

## 2022-02-01 NOTE — Therapy (Signed)
OUTPATIENT PHYSICAL THERAPY TREATMENT   Patient Name: Rachel Wood MRN: [id number] DOB:[birth year], 71 y.o., female Today's Date: 02/01/2022   PT End of Session - 02/01/22 1304     Visit Number 8    Number of Visits 12    Date for PT Re-Evaluation 02/19/22    Authorization Type [employer] Medicare (no auth, no VL)    Progress Note Due on Visit 10    PT Start Time 1304    PT Stop Time 1345    PT Time Calculation (min) 41 min    Activity Tolerance Patient tolerated treatment well    Behavior During Therapy [facility] for tasks assessed/performed              Past Medical History:  Diagnosis Date   Arthritis    Cancer ([place]) 2004   Breast Cancer   Constipation    Depression    GERD (gastroesophageal reflux disease)    History of kidney stones    Hypercholesteremia    Hypertension    Osteoporosis    Personal history of chemotherapy 2004   Personal history of radiation therapy 2004   PONV (postoperative nausea and vomiting)    Stroke [facility])    While on Tamoxifen   Past Surgical History:  Procedure Laterality Date   CHOLECYSTECTOMY     COLONOSCOPY  12/24/2011   Procedure: COLONOSCOPY;  Surgeon: Danie Binder, MD;  [facility];  Service: Endoscopy;  Laterality: N/A;  8:30 AM   Left breast lumpectomy  2004   LYMPH GLAND EXCISION     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   Procedure: TOTAL KNEE ARTHROPLASTY;  Surgeon: Willaim Sheng, MD;  Location: [facility];  Service: Orthopedics;  Laterality: Left;   TRACHEOSTOMY     as a child   Patient Active Problem List   Diagnosis Date Noted   Gastroesophageal reflux disease without esophagitis 05/01/2018   Hypertension 03/07/2016   History of melanoma 12/20/2014   Hyperlipidemia 12/20/2014    PCP: Anastasia Pall MD   REFERRING PROVIDER: Willaim Sheng, MD   REFERRING DIAG: Post OP left knee Replacement   THERAPY DIAG:  Left knee pain, unspecified chronicity  Stiffness of left knee, not elsewhere  classified  Other abnormalities of gait and mobility  Rationale for Evaluation and Treatment Rehabilitation  ONSET DATE: DOS: 12/27/21  SUBJECTIVE:   SUBJECTIVE STATEMENT: Patient reports wearing the compression sock has helped a lot with her ROM and swelling. Night time is still rough trying to get to sleep due to the cramps. States she bought some OTC foam that has helped but admits to not drinking enough water.   PERTINENT HISTORY: Lt TKA 12/27/21; RT TKA 2022  PAIN:  Are you having pain? Yes: NPRS scale: 3/10 Pain location: LT knee Pain description: tight, stiff, sore Aggravating factors: WB, standing, prolonged position Relieving factors: stretching, socks, movement  PRECAUTIONS: None  WEIGHT BEARING RESTRICTIONS No  FALLS:  Has patient fallen in last 6 months? No  LIVING ENVIRONMENT: Lives with: lives with their spouse Lives in: House/apartment Stairs: Yes: External: 1 steps; none Has following equipment at home: Environmental consultant - 2 wheeled  OCCUPATION: Fish farm manager   PLOF: Independent  PATIENT GOALS  Be able to work in my yard and be on my feet without pain   OBJECTIVE:   DIAGNOSTIC FINDINGS: NA  PATIENT SURVEYS:  FOTO 44% function   COGNITION:  Overall cognitive status: Within functional limits for tasks assessed     SENSATION:  WFL  EDEMA:  Minimal   PALPATION: Min TTP   LOWER EXTREMITY ROM:  Active ROM (degrees) Right eval Left eval Left 01/10/22 Left 01/15/22 Left 01/18/22 Left 01/23/22 Left  02/01/22  Hip flexion         Hip extension         Hip abduction         Hip adduction         Hip internal rotation         Hip external rotation         Knee flexion  88 100 101 improves to 108 with slides 102 improves to 109 with heel slides AROM 105 AAROM 110 AROM 117 AAROM 121  Knee extension  -4 -3 0   0  Ankle dorsiflexion         Ankle plantarflexion         Ankle inversion         Ankle eversion          (Blank rows = not tested)  LOWER  EXTREMITY MMT:  MMT Right eval Left eval  Hip flexion 5 4  Hip extension    Hip abduction    Hip adduction    Hip internal rotation    Hip external rotation    Knee flexion 5 4  Knee extension 5 4  Ankle dorsiflexion 5 4  Ankle plantarflexion    Ankle inversion    Ankle eversion     (Blank rows = not tested)   FUNCTIONAL TESTS:  2 minute walk test: 280 feet with no AD  GAIT: Distance walked: 280 feet Assistive device utilized: None Level of assistance: SBA Comments: 2MWT    TODAY'S TREATMENT: 02/01/22 Bike seat 8 full revolutions 5 minutes level 3 SLR 2x 10  Heel slides 10 x 10 second holds with strap Step up 6 inch x 20  Lateral step down 6 inch 2x 10  Standing hip abduction 2x 10 RTB at knees Standing hip extension 2x 10 RTB at knees TKE 4 plates 15 x 5 second holds STS 2x 10    01/30/22 Bike seat 10 rocking 4 minutes Standing:  12" Lt knee flexion stretch 10X10"  Slant board stretch 3X30"  Heeraise/toeraises 20X each  lateral step up 6" 10X with 1 HHA  Forward step up 6"10X   Bodycraft TKE 4Pl 15X5" holds Manual STM and PROM knee flexion (117) Lt knee AROM  0-110   01/25/22 Nustep (seat 8) 5 min warmup for ROM   Calf stretch 3 x 30"  Heel raise x20 Toe raise x20 Step up 7 inch 2 x 10  Side step up on 7 inch step 2 x 10 HHA x 2  Knee drive (on second step) 10 x 10"  Standing hip abduction 2 x 10 Tandem stance 3 x 30"  TKE 3 plates   Manual PROM knee flexion (got up to 112 deg flexion)   Clinic ambulation 2 RT   LT knee AROM (0-108 deg)  01/23/22 Bike seat 10 rocking Standing:  12" box knee flexion stretch 10X10"  Lt knee flexion  Slant board stretch 3x 20 second holds  lateral step up 4" 10X with 1 HHA  Forward step up 4"10X  Supine:  Lt SLR 10X  Lt heelslides 2X10 Manual:  STM and scar massage prior to AROM/AAROM Knee flexion 105/110 following    01/18/22 Bike seat 10 rocking for dynamic warm up and holds at end range flexion 5  minutes  Heel slides 10x 10 second holds with strap SLR with quad set 3 x 10  HR on incline slope 1x 20 Slant board stretch 3x 20 second holds Step up 6 inch step 2x 10  Lateral step up 6 inch 2x 10  Mini squat 2x 10 with bilateral UE support TKE 2 plates 10x 10 second holds   PATIENT EDUCATION:  Education details: on evaluation findings, POC and HEP 7/3 HEP Person educated: Patient Education method: Explanation Education comprehension: verbalized understanding   HOME EXERCISE PROGRAM: EVAL: Reviewed post op handout, same as above 7/3 Access Code: [email or web address] - Supine Heel Slide with Strap  - 3 x daily - 7 x weekly - 10 reps - 10 second hold - Squat with Counter Support  - 1 x daily - 7 x weekly - 2 sets - 10 reps - Seated Long Arc Quad (Mirrored)  - 1 x daily - 7 x weekly - 10 reps - 5-10 second hold 7/20 - Hip Abduction with Resistance Loop  - 1 x daily - 7 x weekly - 2 sets - 10 reps - Sit to Stand with Arms Crossed  - 1 x daily - 7 x weekly - 3 sets - 10 reps  ASSESSMENT:  CLINICAL IMPRESSION: Began on bike for dynamic warm up and patient able to complete full revolutions today. Patient demonstrating improving ROM from 0 to 117 today following bike. She tolerates LE strengthening exercises well without increase in symptoms. Patient will continue to benefit from physical therapy in order to improve function and reduce impairment.    OBJECTIVE IMPAIRMENTS Abnormal gait, decreased activity tolerance, decreased balance, decreased mobility, difficulty walking, decreased ROM, decreased strength, hypomobility, increased edema, increased fascial restrictions, impaired flexibility, improper body mechanics, and pain.   ACTIVITY LIMITATIONS lifting, bending, sitting, standing, squatting, sleeping, stairs, transfers, and locomotion level  PARTICIPATION LIMITATIONS: meal prep, cleaning, laundry, driving, shopping, community activity, occupation, and yard work  PERSONAL FACTORS  None  are  also affecting patient's functional outcome.   REHAB POTENTIAL: Good  CLINICAL DECISION MAKING: Stable/uncomplicated  EVALUATION COMPLEXITY: Low   GOALS: SHORT TERM GOALS: Target date: 02/05/2022  Patient will be independent with initial HEP and self-management strategies to improve functional outcomes Baseline:  Goal status: IN PROGRESS   LONG TERM GOALS: Target date: 02/19/2022  Patient will be independent with advanced HEP and self-management strategies to improve functional outcomes Baseline:  Goal status: IN PROGRESS  2.  Patient will improve FOTO score to predicted value to indicate improvement in functional outcomes Baseline: 44% function Goal status: IN PROGRESS  3.  Patient will have LT knee AROM 0-120 degrees to improve functional mobility and facilitate squatting to pick up items from floor. Baseline: -4 to 88 degrees Goal status: IN PROGRESS  4. Patient will have equal to or > 4+/5 MMT throughout  to improve ability to perform functional mobility, stair ambulation and ADLs.  Baseline: See MMT Goal status: IN PROGRESS    5. Patient will be able to ambulate at least 350 feet during 2MWT with LRAD to demonstrate improved ability to perform functional mobility and associated tasks. Baseline: 280 feet no AD Goal status: IN PROGRESS PLAN: PT FREQUENCY: 2x/week  PT DURATION: 6 weeks  PLANNED INTERVENTIONS: Therapeutic exercises, Therapeutic activity, Neuromuscular re-education, Balance training, Gait training, Patient/Family education, Joint manipulation, Joint mobilization, Stair training, Aquatic Therapy, Dry Needling, Electrical stimulation, Spinal manipulation, Spinal mobilization, Cryotherapy, Moist heat, scar mobilization, Taping, Traction, Ultrasound, Biofeedback, Ionotophoresis '4mg'$ /ml Dexamethasone, and Manual therapy.  PLAN FOR NEXT SESSION: Progress knee strength and AROM. Stairs and balance   1:09 PM, 02/01/22 Mearl Latin PT, DPT Physical  Therapist at [facility]

## 2022-02-06 ENCOUNTER — Encounter (HOSPITAL_COMMUNITY): Payer: Medicare Other | Admitting: Physical Therapy

## 2022-02-06 ENCOUNTER — Ambulatory Visit (HOSPITAL_COMMUNITY): Payer: Medicare Other | Admitting: Physical Therapy

## 2022-02-06 DIAGNOSIS — M25562 Pain in left knee: Secondary | ICD-10-CM | POA: Diagnosis not present

## 2022-02-06 DIAGNOSIS — R2689 Other abnormalities of gait and mobility: Secondary | ICD-10-CM

## 2022-02-06 DIAGNOSIS — M25662 Stiffness of left knee, not elsewhere classified: Secondary | ICD-10-CM

## 2022-02-06 NOTE — Therapy (Signed)
OUTPATIENT PHYSICAL THERAPY TREATMENT   Patient Name: Rachel Wood MRN: [id number] DOB:[birth year], 71 y.o., female Today's Date: 02/06/2022   PT End of Session - 02/06/22 1343     Visit Number 9    Number of Visits 12    Date for PT Re-Evaluation 02/19/22    Authorization Type [employer] Medicare (no auth, no VL)    Progress Note Due on Visit 10    PT Start Time 1348    PT Stop Time 1426    PT Time Calculation (min) 38 min    Activity Tolerance Patient tolerated treatment well    Behavior During Therapy [facility] for tasks assessed/performed              Past Medical History:  Diagnosis Date   Arthritis    Cancer ([place]) 2004   Breast Cancer   Constipation    Depression    GERD (gastroesophageal reflux disease)    History of kidney stones    Hypercholesteremia    Hypertension    Osteoporosis    Personal history of chemotherapy 2004   Personal history of radiation therapy 2004   PONV (postoperative nausea and vomiting)    Stroke [facility])    While on Tamoxifen   Past Surgical History:  Procedure Laterality Date   CHOLECYSTECTOMY     COLONOSCOPY  12/24/2011   Procedure: COLONOSCOPY;  Surgeon: Danie Binder, MD;  [facility];  Service: Endoscopy;  Laterality: N/A;  8:30 AM   Left breast lumpectomy  2004   LYMPH GLAND EXCISION     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   Procedure: TOTAL KNEE ARTHROPLASTY;  Surgeon: Willaim Sheng, MD;  Location: [facility];  Service: Orthopedics;  Laterality: Left;   TRACHEOSTOMY     as a child   Patient Active Problem List   Diagnosis Date Noted   Gastroesophageal reflux disease without esophagitis 05/01/2018   Hypertension 03/07/2016   History of melanoma 12/20/2014   Hyperlipidemia 12/20/2014    PCP: Anastasia Pall MD   REFERRING PROVIDER: Willaim Sheng, MD   REFERRING DIAG: Post OP left knee Replacement   THERAPY DIAG:  Left knee pain, unspecified chronicity  Stiffness of left knee, not elsewhere  classified  Other abnormalities of gait and mobility  Rationale for Evaluation and Treatment Rehabilitation  ONSET DATE: DOS: 12/27/21  SUBJECTIVE:   SUBJECTIVE STATEMENT: Patient reports wearing the compression sock has helped a lot with her ROM and swelling. Night time is still rough trying to get to sleep due to the cramps. States she bought some OTC foam that has helped but admits to not drinking enough water.   PERTINENT HISTORY: Lt TKA 12/27/21; RT TKA 2022  PAIN:  Are you having pain? Yes: NPRS scale: 3/10 Pain location: LT knee Pain description: tight, stiff, sore Aggravating factors: WB, standing, prolonged position Relieving factors: stretching, socks, movement  PRECAUTIONS: None  WEIGHT BEARING RESTRICTIONS No  FALLS:  Has patient fallen in last 6 months? No  LIVING ENVIRONMENT: Lives with: lives with their spouse Lives in: House/apartment Stairs: Yes: External: 1 steps; none Has following equipment at home: Environmental consultant - 2 wheeled  OCCUPATION: Fish farm manager   PLOF: Independent  PATIENT GOALS  Be able to work in my yard and be on my feet without pain   OBJECTIVE:   DIAGNOSTIC FINDINGS: NA  PATIENT SURVEYS:  FOTO 44% function   COGNITION:  Overall cognitive status: Within functional limits for tasks assessed     SENSATION:  WFL  EDEMA:  Minimal   PALPATION: Min TTP   LOWER EXTREMITY ROM:  Active ROM (degrees) Right eval Left eval Left 01/10/22 Left 01/15/22 Left 01/18/22 Left 01/23/22 Left  02/01/22  Hip flexion         Hip extension         Hip abduction         Hip adduction         Hip internal rotation         Hip external rotation         Knee flexion  88 100 101 improves to 108 with slides 102 improves to 109 with heel slides AROM 105 AAROM 110 AROM 117 AAROM 121  Knee extension  -4 -3 0   0  Ankle dorsiflexion         Ankle plantarflexion         Ankle inversion         Ankle eversion          (Blank rows = not tested)  LOWER  EXTREMITY MMT:  MMT Right eval Left eval  Hip flexion 5 4  Hip extension    Hip abduction    Hip adduction    Hip internal rotation    Hip external rotation    Knee flexion 5 4  Knee extension 5 4  Ankle dorsiflexion 5 4  Ankle plantarflexion    Ankle inversion    Ankle eversion     (Blank rows = not tested)   FUNCTIONAL TESTS:  2 minute walk test: 280 feet with no AD  GAIT: Distance walked: 280 feet Assistive device utilized: None Level of assistance: SBA Comments: 2MWT    TODAY'S TREATMENT: 02/06/22 Rec bike (seat 8) lv 3 4 min AROM warmup  Slant board stretch 3X30" Heel raise/toeraises 20X each Bodycraft TKE 4Pl 20 x 3" holds Step up 6 inch 2 x 10 no HHA Step down 6 inch 2 x 10 HHA x 1  Stairs 4 inch 3 RT recip, no rail Stairs 7 inch 3 RT recip single rail on descent Machine walkout 4 plates x10 Clinic amb 2 RT no AD  SLS x 15" each on solid floor  Leg press 3 plates 2 E33  LT knee AROM ( 0-119 deg)   02/01/22 Bike seat 8 full revolutions 5 minutes level 3 SLR 2x 10  Heel slides 10 x 10 second holds with strap Step up 6 inch x 20  Lateral step down 6 inch 2x 10  Standing hip abduction 2x 10 RTB at knees Standing hip extension 2x 10 RTB at knees TKE 4 plates 15 x 5 second holds STS 2x 10    01/30/22 Bike seat 10 rocking 4 minutes Standing:  12" Lt knee flexion stretch 10X10"  Slant board stretch 3X30"  Heeraise/toeraises 20X each  lateral step up 6" 10X with 1 HHA  Forward step up 6"10X   Bodycraft TKE 4Pl 15X5" holds Manual STM and PROM knee flexion (117) Lt knee AROM  0-110   PATIENT EDUCATION:  Education details: on evaluation findings, POC and HEP 7/3 HEP Person educated: Patient Education method: Explanation Education comprehension: verbalized understanding   HOME EXERCISE PROGRAM: EVAL: Reviewed post op handout, same as above 7/3 Access Code: [email or web address] - Supine Heel Slide with Strap  - 3 x daily - 7 x weekly - 10 reps - 10 second  hold - Squat with Counter Support  - 1 x daily - 7 x weekly -  2 sets - 10 reps - Seated Long Arc Quad (Mirrored)  - 1 x daily - 7 x weekly - 10 reps - 5-10 second hold 7/20 - Hip Abduction with Resistance Loop  - 1 x daily - 7 x weekly - 2 sets - 10 reps - Sit to Stand with Arms Crossed  - 1 x daily - 7 x weekly - 3 sets - 10 reps  ASSESSMENT:  CLINICAL IMPRESSION: Patient showing very good progress. Able to progress to more challenging strengthening exercises toady with good result. Patient showing good ROM and functional mobility. Still slightly limited by balance and decreased quad control descending standard height step. Patient will continue to benefit from skilled therapy services to reduce remaining deficits and improve functional ability.   OBJECTIVE IMPAIRMENTS Abnormal gait, decreased activity tolerance, decreased balance, decreased mobility, difficulty walking, decreased ROM, decreased strength, hypomobility, increased edema, increased fascial restrictions, impaired flexibility, improper body mechanics, and pain.   ACTIVITY LIMITATIONS lifting, bending, sitting, standing, squatting, sleeping, stairs, transfers, and locomotion level  PARTICIPATION LIMITATIONS: meal prep, cleaning, laundry, driving, shopping, community activity, occupation, and yard work  PERSONAL FACTORS  None  are also affecting patient's functional outcome.   REHAB POTENTIAL: Good  CLINICAL DECISION MAKING: Stable/uncomplicated  EVALUATION COMPLEXITY: Low   GOALS: SHORT TERM GOALS: Target date: 02/05/2022  Patient will be independent with initial HEP and self-management strategies to improve functional outcomes Baseline:  Goal status: IN PROGRESS   LONG TERM GOALS: Target date: 02/19/2022  Patient will be independent with advanced HEP and self-management strategies to improve functional outcomes Baseline:  Goal status: IN PROGRESS  2.  Patient will improve FOTO score to predicted value to indicate  improvement in functional outcomes Baseline: 44% function Goal status: IN PROGRESS  3.  Patient will have LT knee AROM 0-120 degrees to improve functional mobility and facilitate squatting to pick up items from floor. Baseline: -4 to 88 degrees Goal status: IN PROGRESS  4. Patient will have equal to or > 4+/5 MMT throughout  to improve ability to perform functional mobility, stair ambulation and ADLs.  Baseline: See MMT Goal status: IN PROGRESS    5. Patient will be able to ambulate at least 350 feet during 2MWT with LRAD to demonstrate improved ability to perform functional mobility and associated tasks. Baseline: 280 feet no AD Goal status: IN PROGRESS PLAN: PT FREQUENCY: 2x/week  PT DURATION: 6 weeks  PLANNED INTERVENTIONS: Therapeutic exercises, Therapeutic activity, Neuromuscular re-education, Balance training, Gait training, Patient/Family education, Joint manipulation, Joint mobilization, Stair training, Aquatic Therapy, Dry Needling, Electrical stimulation, Spinal manipulation, Spinal mobilization, Cryotherapy, Moist heat, scar mobilization, Taping, Traction, Ultrasound, Biofeedback, Ionotophoresis '4mg'$ /ml Dexamethasone, and Manual therapy.   PLAN FOR NEXT SESSION: Progress knee strength and AROM. Stairs and balance   1:49 PM, 02/06/22 Josue Hector PT DPT  Physical Therapist with [facility]  [phone]

## 2022-02-08 ENCOUNTER — Encounter (HOSPITAL_COMMUNITY): Payer: Medicare Other | Admitting: Physical Therapy

## 2022-02-08 ENCOUNTER — Ambulatory Visit (HOSPITAL_COMMUNITY): Payer: Medicare Other | Admitting: Physical Therapy

## 2022-02-08 ENCOUNTER — Encounter (HOSPITAL_COMMUNITY): Payer: Self-pay | Admitting: Physical Therapy

## 2022-02-08 DIAGNOSIS — M25562 Pain in left knee: Secondary | ICD-10-CM

## 2022-02-08 DIAGNOSIS — M25662 Stiffness of left knee, not elsewhere classified: Secondary | ICD-10-CM

## 2022-02-08 DIAGNOSIS — R2689 Other abnormalities of gait and mobility: Secondary | ICD-10-CM

## 2022-02-08 NOTE — Therapy (Signed)
OUTPATIENT PHYSICAL THERAPY TREATMENT   Patient Name: Rachel Wood MRN: [id number] DOB:[birth year], 71 y.o., female Today's Date: 02/08/2022  Progress Note Reporting Period 01/08/22 to 02/08/22  See note below for Objective Data and Assessment of Progress/Goals.      PT End of Session - 02/08/22 0902     Visit Number 10    Number of Visits 12    Date for PT Re-Evaluation 02/19/22    Authorization Type U[employer] Medicare (no auth, no VL)    Progress Note Due on Visit 10    PT Start Time 0900    PT Stop Time 0940    PT Time Calculation (min) 40 min    Activity Tolerance Patient tolerated treatment well    Behavior During Therapy [facility] for tasks assessed/performed              Past Medical History:  Diagnosis Date   Arthritis    Cancer ([place]) 2004   Breast Cancer   Constipation    Depression    GERD (gastroesophageal reflux disease)    History of kidney stones    Hypercholesteremia    Hypertension    Osteoporosis    Personal history of chemotherapy 2004   Personal history of radiation therapy 2004   PONV (postoperative nausea and vomiting)    Stroke [facility])    While on Tamoxifen   Past Surgical History:  Procedure Laterality Date   CHOLECYSTECTOMY     COLONOSCOPY  12/24/2011   Procedure: COLONOSCOPY;  Surgeon: Danie Binder, MD;  [facility];  Service: Endoscopy;  Laterality: N/A;  8:30 AM   Left breast lumpectomy  2004   LYMPH GLAND EXCISION     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   Procedure: TOTAL KNEE ARTHROPLASTY;  Surgeon: Willaim Sheng, MD;  Location: [facility];  Service: Orthopedics;  Laterality: Left;   TRACHEOSTOMY     as a child   Patient Active Problem List   Diagnosis Date Noted   Gastroesophageal reflux disease without esophagitis 05/01/2018   Hypertension 03/07/2016   History of melanoma 12/20/2014   Hyperlipidemia 12/20/2014    PCP: Anastasia Pall MD   REFERRING PROVIDER: Willaim Sheng, MD   REFERRING DIAG: Post  OP left knee Replacement   THERAPY DIAG:  Left knee pain, unspecified chronicity  Stiffness of left knee, not elsewhere classified  Other abnormalities of gait and mobility  Rationale for Evaluation and Treatment Rehabilitation  ONSET DATE: DOS: 12/27/21  SUBJECTIVE:   SUBJECTIVE STATEMENT: Patient states she is doing much better but feels she is still not quite there with ROM and maybe balance. She feels she is about 95% better since starting therapy. Still some occasional tightness. Swelling is much better.   PERTINENT HISTORY: Lt TKA 12/27/21; RT TKA 2022  PAIN:  Are you having pain? No  PRECAUTIONS: None  WEIGHT BEARING RESTRICTIONS No  FALLS:  Has patient fallen in last 6 months? No  LIVING ENVIRONMENT: Lives with: lives with their spouse Lives in: House/apartment Stairs: Yes: External: 1 steps; none Has following equipment at home: Environmental consultant - 2 wheeled  OCCUPATION: Fish farm manager   PLOF: Independent  PATIENT GOALS  Be able to work in my yard and be on my feet without pain   OBJECTIVE:   DIAGNOSTIC FINDINGS: NA  PATIENT SURVEYS:  FOTO 44% function   COGNITION:  Overall cognitive status: Within functional limits for tasks assessed     SENSATION: WFL  EDEMA:  Minimal   PALPATION:  Min TTP   LOWER EXTREMITY ROM:  Active ROM (degrees) Right eval Left eval Left 01/10/22 Left 01/15/22 Left 01/18/22 Left 01/23/22 Left  02/01/22 Left 02/08/22  Hip flexion          Hip extension          Hip abduction          Hip adduction          Hip internal rotation          Hip external rotation          Knee flexion  88 100 101 improves to 108 with slides 102 improves to 109 with heel slides AROM 105 AAROM 110 AROM 117 AAROM 121 120  Knee extension  -4 -3 0   0 0  Ankle dorsiflexion          Ankle plantarflexion          Ankle inversion          Ankle eversion           (Blank rows = not tested)  LOWER EXTREMITY MMT:  MMT Right eval Left eval  RT 02/08/22 LT  02/08/22  Hip flexion _0 4+  Hip extension   3+ 3+  Hip abduction   4+ 4  Hip adduction      Hip internal rotation      Hip external rotation      Knee flexion _1 Knee extension _2 4+  Ankle dorsiflexion _3 Ankle plantarflexion      Ankle inversion      Ankle eversion       (Blank rows = not tested)   FUNCTIONAL TESTS:  2 minute walk test: 475 feet no AD  GAIT: Distance walked: 475 feet Assistive device utilized: None Level of assistance: Complete Independence Comments: 2MWT    TODAY'S TREATMENT: 02/08/22  Reassess  Rec bike (seat 9) lv 3 6 min AROM warmup  AROM MMT 2MWT Stairs 7 inch 5 RT reciprocal NO HHA Standing toe raise at wall 2 x10 Single leg stance 3 x 10" holds (14 sec max on LLE)  Standing hip abduction/ extension RTB 10 x 3" each   02/06/22 Rec bike (seat 8) lv 3 4 min AROM warmup  Slant board stretch 3X30" Heel raise/toeraises 20X each Bodycraft TKE 4Pl 20 x 3" holds Step up 6 inch 2 x 10 no HHA Step down 6 inch 2 x 10 HHA x 1  Stairs 4 inch 3 RT recip, no rail Stairs 7 inch 3 RT recip single rail on descent Machine walkout 4 plates x10 Clinic amb 2 RT no AD  SLS x 15" each on solid floor  Leg press 3 plates 2 V37  LT knee AROM ( 0-119 deg)   02/01/22 Bike seat 8 full revolutions 5 minutes level 3 SLR 2x 10  Heel slides 10 x 10 second holds with strap Step up 6 inch x 20  Lateral step down 6 inch 2x 10  Standing hip abduction 2x 10 RTB at knees Standing hip extension 2x 10 RTB at knees TKE 4 plates 15 x 5 second holds STS 2x 10    01/30/22 Bike seat 10 rocking 4 minutes Standing:  12" Lt knee flexion stretch 10X10"  Slant board stretch 3X30"  Heeraise/toeraises 20X each  lateral step up 6" 10X with 1 HHA  Forward step up 6"10X   Bodycraft TKE 4Pl 15X5" holds  Manual STM and PROM knee flexion (117) Lt knee AROM  0-110   PATIENT EDUCATION:  Education details: on evaluation findings, POC and HEP  7/3 HEP Person educated: Patient Education method: Explanation Education comprehension: verbalized understanding   HOME EXERCISE PROGRAM: EVAL: Reviewed post op handout, same as above Access Code: [email or web address] 02/08/22 - Hip Extension with Resistance Loop  - 1-2 x daily - 7 x weekly - 2 sets - 10 reps - 3 second hold - Single Leg Stance with Support  - 1-2 x daily - 7 x weekly - 1 sets - 5 reps - 15-20 second hold - Standing Heel Raise with Support  - 1-2 x daily - 7 x weekly - 2 sets - 10 reps - Toe Raise With Back Against Wall  - 1-2 x daily - 7 x weekly - 2 sets - 10 reps  7/3  - Supine Heel Slide with Strap  - 3 x daily - 7 x weekly - 10 reps - 10 second hold - Squat with Counter Support  - 1 x daily - 7 x weekly - 2 sets - 10 reps - Seated Long Arc Quad (Mirrored)  - 1 x daily - 7 x weekly - 10 reps - 5-10 second hold 7/20 - Hip Abduction with Resistance Loop  - 1 x daily - 7 x weekly - 2 sets - 10 reps - Sit to Stand with Arms Crossed  - 1 x daily - 7 x weekly - 3 sets - 10 reps  ASSESSMENT:  CLINICAL IMPRESSION: Patient has made good progress to therapy goals. She has currently met ambulation and AROM goals. Pain is well managed. She is still limited mainly by hip extension strength and balance which continues to impact functional ability. Patient will continue to benefit from skilled therapy services to address remaining deficits for reduced pain and improved LOF with ADLs.   OBJECTIVE IMPAIRMENTS Abnormal gait, decreased activity tolerance, decreased balance, decreased mobility, difficulty walking, decreased ROM, decreased strength, hypomobility, increased edema, increased fascial restrictions, impaired flexibility, improper body mechanics, and pain.   ACTIVITY LIMITATIONS lifting, bending, sitting, standing, squatting, sleeping, stairs, transfers, and locomotion level  PARTICIPATION LIMITATIONS: meal prep, cleaning, laundry, driving, shopping, community activity, occupation, and  yard work  PERSONAL FACTORS  None  are also affecting patient's functional outcome.   REHAB POTENTIAL: Good  CLINICAL DECISION MAKING: Stable/uncomplicated  EVALUATION COMPLEXITY: Low   GOALS: SHORT TERM GOALS: Target date: 02/05/2022  Patient will be independent with initial HEP and self-management strategies to improve functional outcomes Baseline:  Goal status: MET   LONG TERM GOALS: Target date: 02/19/2022  Patient will be independent with advanced HEP and self-management strategies to improve functional outcomes Baseline:  Goal status: IN PROGRESS  2.  Patient will report at least 80% overall improvement in subjective complaint to indicate improvement in ability to perform ADLs. Baseline: 95% Goal status: REVISED (original goal was FOTO score, but it appears patient was deleted from [place] mistakenly)   3.  Patient will have LT knee AROM 0-120 degrees to improve functional mobility and facilitate squatting to pick up items from floor. Baseline: 0-120 deg (after manual stretching)  Goal status: MET  4. Patient will have equal to or > 4+/5 MMT throughout  to improve ability to perform functional mobility, stair ambulation and ADLs.  Baseline: See MMT Goal status: partially MET (all but hip extension)     5. Patient will be able to ambulate at least 350 feet during 2MWT with LRAD  to demonstrate improved ability to perform functional mobility and associated tasks. Baseline: 475 feet no AD Goal status: MET PLAN: PT FREQUENCY: 2x/week  PT DURATION: 6 weeks  PLANNED INTERVENTIONS: Therapeutic exercises, Therapeutic activity, Neuromuscular re-education, Balance training, Gait training, Patient/Family education, Joint manipulation, Joint mobilization, Stair training, Aquatic Therapy, Dry Needling, Electrical stimulation, Spinal manipulation, Spinal mobilization, Cryotherapy, Moist heat, scar mobilization, Taping, Traction, Ultrasound, Biofeedback, Ionotophoresis 36m/ml  Dexamethasone, and Manual therapy.   PLAN FOR NEXT SESSION: Progress balance and glute strength. Likely DC in next 2 visits.   9:02 AM, 02/08/22 CJosue HectorPT DPT  Physical Therapist with [facility] ([phone]

## 2022-02-13 ENCOUNTER — Encounter (HOSPITAL_COMMUNITY): Payer: Medicare Other

## 2022-02-13 ENCOUNTER — Ambulatory Visit (HOSPITAL_COMMUNITY): Payer: Medicare Other | Attending: Family Medicine | Admitting: Physical Therapy

## 2022-02-13 DIAGNOSIS — R2689 Other abnormalities of gait and mobility: Secondary | ICD-10-CM | POA: Insufficient documentation

## 2022-02-13 DIAGNOSIS — M25662 Stiffness of left knee, not elsewhere classified: Secondary | ICD-10-CM | POA: Diagnosis present

## 2022-02-13 DIAGNOSIS — M25562 Pain in left knee: Secondary | ICD-10-CM | POA: Insufficient documentation

## 2022-02-13 NOTE — Therapy (Signed)
OUTPATIENT PHYSICAL THERAPY TREATMENT   Patient Name: Rachel Wood MRN: [id number] DOB:1950-08-05, 71 y.o., female Today's Date: 02/13/2022     PT End of Session - 02/13/22 1310     Visit Number 11    Number of Visits 12    Date for PT Re-Evaluation 02/19/22    Authorization Type UHC Medicare (no auth, no VL)    Progress Note Due on Visit 12    PT Start Time 3291    PT Stop Time 1345    PT Time Calculation (min) 40 min    Activity Tolerance Patient tolerated treatment well    Behavior During Therapy [facility] for tasks assessed/performed               Past Medical History:  Diagnosis Date   Arthritis    Cancer ([place]) 2004   Breast Cancer   Constipation    Depression    GERD (gastroesophageal reflux disease)    History of kidney stones    Hypercholesteremia    Hypertension    Osteoporosis    Personal history of chemotherapy 2004   Personal history of radiation therapy 2004   PONV (postoperative nausea and vomiting)    Stroke [facility])    While on Tamoxifen   Past Surgical History:  Procedure Laterality Date   CHOLECYSTECTOMY     COLONOSCOPY  12/24/2011   Procedure: COLONOSCOPY;  Surgeon: Danie Binder, MD;  [facility];  Service: Endoscopy;  Laterality: N/A;  8:30 AM   Left breast lumpectomy  2004   LYMPH GLAND EXCISION     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   Procedure: TOTAL KNEE ARTHROPLASTY;  Surgeon: Willaim Sheng, MD;  Location: [facility];  Service: Orthopedics;  Laterality: Left;   TRACHEOSTOMY     as a child   Patient Active Problem List   Diagnosis Date Noted   Gastroesophageal reflux disease without esophagitis 05/01/2018   Hypertension 03/07/2016   History of melanoma 12/20/2014   Hyperlipidemia 12/20/2014    PCP: Anastasia Pall MD   REFERRING PROVIDER: Willaim Sheng, MD   REFERRING DIAG: Post OP left knee Replacement   THERAPY DIAG:  Left knee pain, unspecified chronicity  Stiffness of left knee, not elsewhere  classified  Other abnormalities of gait and mobility  Rationale for Evaluation and Treatment Rehabilitation  ONSET DATE: DOS: 12/27/21  SUBJECTIVE:   SUBJECTIVE STATEMENT: Patient reports stiffness is most persistent.  States her hamstring is really weak also.  Feels she will be able to continue her HEP after next session.   PERTINENT HISTORY: Lt TKA 12/27/21; RT TKA 2022  PAIN:  Are you having pain? No  PRECAUTIONS: None  WEIGHT BEARING RESTRICTIONS No  FALLS:  Has patient fallen in last 6 months? No  LIVING ENVIRONMENT: Lives with: lives with their spouse Lives in: House/apartment Stairs: Yes: External: 1 steps; none Has following equipment at home: Environmental consultant - 2 wheeled  OCCUPATION: Floral design   PLOF: Independent  PATIENT GOALS  Be able to work in my yard and be on my feet without pain   OBJECTIVE:   DIAGNOSTIC FINDINGS: NA  PATIENT SURVEYS:  FOTO 44% function   COGNITION:  Overall cognitive status: Within functional limits for tasks assessed     SENSATION: WFL  EDEMA:  Minimal   PALPATION: Min TTP   LOWER EXTREMITY ROM:  Active ROM (degrees) Right eval Left eval Left 01/10/22 Left 01/15/22 Left 01/18/22 Left 01/23/22 Left  02/01/22 Left 02/08/22  Hip  flexion          Hip extension          Hip abduction          Hip adduction          Hip internal rotation          Hip external rotation          Knee flexion  88 100 101 improves to 108 with slides 102 improves to 109 with heel slides AROM 105 AAROM 110 AROM 117 AAROM 121 120  Knee extension  -4 -3 0   0 0  Ankle dorsiflexion          Ankle plantarflexion          Ankle inversion          Ankle eversion           (Blank rows = not tested)  LOWER EXTREMITY MMT:  MMT Right eval Left eval RT 02/08/22 LT  02/08/22  Hip flexion 5 4 5  4+  Hip extension   3+ 3+  Hip abduction   4+ 4  Hip adduction      Hip internal rotation      Hip external rotation      Knee flexion 5 4 5 5   Knee  extension 5 4 5  4+  Ankle dorsiflexion 5 4 5 5   Ankle plantarflexion      Ankle inversion      Ankle eversion       (Blank rows = not tested)   FUNCTIONAL TESTS:  2 minute walk test: 475 feet no AD  GAIT: Distance walked: 475 feet Assistive device utilized: None Level of assistance: Complete Independence Comments: 2MWT    TODAY'S TREATMENT: 02/13/22 Bike seat 9 level 3 5 minutes Standing:  Lt hamstring curls 2X10   6" lateral step down eccentric lowering (heel first) 2X10   6" forward step ups 20X   Reciprocally with 1 HR 6"   Tandem 30" each LE lead   Tandem on foam 30" each LE lead   Vectors with 1 HHA on Lt 5X5"   Stand against wall and push up onto toes 20X   Single leg stance 3 x 10" holds Prone: hamstring curls Lt 2X10   Lt LE raise with knee flexion 10X   02/08/22  Reassess  Rec bike (seat 9) lv 3 6 min AROM warmup  AROM MMT 2MWT Stairs 7 inch 5 RT reciprocal NO HHA Standing toe raise at wall 2 x10 Single leg stance 3 x 10" holds (14 sec max on LLE)  Standing hip abduction/ extension RTB 10 x 3" each   02/06/22 Rec bike (seat 8) lv 3 4 min AROM warmup  Slant board stretch 3X30" Heel raise/toeraises 20X each Bodycraft TKE 4Pl 20 x 3" holds Step up 6 inch 2 x 10 no HHA Step down 6 inch 2 x 10 HHA x 1  Stairs 4 inch 3 RT recip, no rail Stairs 7 inch 3 RT recip single rail on descent Machine walkout 4 plates x10 Clinic amb 2 RT no AD  SLS x 15" each on solid floor  Leg press 3 plates 2 B76  LT knee AROM ( 0-119 deg)   02/01/22 Bike seat 8 full revolutions 5 minutes level 3 SLR 2x 10  Heel slides 10 x 10 second holds with strap Step up 6 inch x 20  Lateral step down 6 inch 2x 10  Standing hip abduction 2x 10  RTB at knees Standing hip extension 2x 10 RTB at knees TKE 4 plates 15 x 5 second holds STS 2x 10    01/30/22 Bike seat 10 rocking 4 minutes Standing:  12" Lt knee flexion stretch 10X10"  Slant board stretch 3X30"  Heeraise/toeraises 20X  each  lateral step up 6" 10X with 1 HHA  Forward step up 6"10X   Bodycraft TKE 4Pl 15X5" holds Manual STM and PROM knee flexion (117) Lt knee AROM  0-110   PATIENT EDUCATION:  Education details: on evaluation findings, POC and HEP 7/3 HEP Person educated: Patient Education method: Explanation Education comprehension: verbalized understanding   HOME EXERCISE PROGRAM: EVAL: Reviewed post op handout, same as above Access Code: [email or web address] 02/08/22 - Hip Extension with Resistance Loop  - 1-2 x daily - 7 x weekly - 2 sets - 10 reps - 3 second hold - Single Leg Stance with Support  - 1-2 x daily - 7 x weekly - 1 sets - 5 reps - 15-20 second hold - Standing Heel Raise with Support  - 1-2 x daily - 7 x weekly - 2 sets - 10 reps - Toe Raise With Back Against Wall  - 1-2 x daily - 7 x weekly - 2 sets - 10 reps  7/3  - Supine Heel Slide with Strap  - 3 x daily - 7 x weekly - 10 reps - 10 second hold - Squat with Counter Support  - 1 x daily - 7 x weekly - 2 sets - 10 reps - Seated Long Arc Quad (Mirrored)  - 1 x daily - 7 x weekly - 10 reps - 5-10 second hold 7/20 - Hip Abduction with Resistance Loop  - 1 x daily - 7 x weekly - 2 sets - 10 reps - Sit to Stand with Arms Crossed  - 1 x daily - 7 x weekly - 3 sets - 10 reps  ASSESSMENT:  CLINICAL IMPRESSION: Continued focus on improving Lt hamstring and glute strength, functional challenges and balance.  Pt pleased with ability to maintain tandem for full 30 seconds and increase challenge with addition of foam surface.  Added vectors this session and prone hip extension with knee flexed to target glutes and with noted challenge.  Pt required manual assist to keep foot in correct alignment.   Pt requires cues to complete stairs in reciprocal pattern as in habit of step to pattern.  Patient will continue to benefit from skilled therapy services to address remaining deficits for reduced pain and improved LOF with ADLs.   OBJECTIVE IMPAIRMENTS  Abnormal gait, decreased activity tolerance, decreased balance, decreased mobility, difficulty walking, decreased ROM, decreased strength, hypomobility, increased edema, increased fascial restrictions, impaired flexibility, improper body mechanics, and pain.   ACTIVITY LIMITATIONS lifting, bending, sitting, standing, squatting, sleeping, stairs, transfers, and locomotion level  PARTICIPATION LIMITATIONS: meal prep, cleaning, laundry, driving, shopping, community activity, occupation, and yard work  PERSONAL FACTORS  None  are also affecting patient's functional outcome.   REHAB POTENTIAL: Good  CLINICAL DECISION MAKING: Stable/uncomplicated  EVALUATION COMPLEXITY: Low   GOALS: SHORT TERM GOALS: Target date: 02/05/2022  Patient will be independent with initial HEP and self-management strategies to improve functional outcomes Baseline:  Goal status: MET   LONG TERM GOALS: Target date: 02/19/2022  Patient will be independent with advanced HEP and self-management strategies to improve functional outcomes Baseline:  Goal status: IN PROGRESS  2.  Patient will report at least 80% overall improvement in subjective complaint to indicate  improvement in ability to perform ADLs. Baseline: 95% Goal status: REVISED (original goal was FOTO score, but it appears patient was deleted from [place] mistakenly)   3.  Patient will have LT knee AROM 0-120 degrees to improve functional mobility and facilitate squatting to pick up items from floor. Baseline: 0-120 deg (after manual stretching)  Goal status: MET  4. Patient will have equal to or > 4+/5 MMT throughout  to improve ability to perform functional mobility, stair ambulation and ADLs.  Baseline: See MMT Goal status: partially MET (all but hip extension)     5. Patient will be able to ambulate at least 350 feet during 2MWT with LRAD to demonstrate improved ability to perform functional mobility and associated tasks. Baseline: 475 feet no AD Goal  status: MET PLAN: PT FREQUENCY: 2x/week  PT DURATION: 6 weeks  PLANNED INTERVENTIONS: Therapeutic exercises, Therapeutic activity, Neuromuscular re-education, Balance training, Gait training, Patient/Family education, Joint manipulation, Joint mobilization, Stair training, Aquatic Therapy, Dry Needling, Electrical stimulation, Spinal manipulation, Spinal mobilization, Cryotherapy, Moist heat, scar mobilization, Taping, Traction, Ultrasound, Biofeedback, Ionotophoresis 4mg /ml Dexamethasone, and Manual therapy.   PLAN FOR NEXT SESSION: Progress balance, hamstring and glute strength. Likely DC next visit.   1:10 PM, 02/13/22 Teena Irani, PTA/CLT [place] Ph: [phone]

## 2022-02-15 ENCOUNTER — Ambulatory Visit (HOSPITAL_COMMUNITY): Payer: Medicare Other | Admitting: Physical Therapy

## 2022-02-15 ENCOUNTER — Encounter (HOSPITAL_COMMUNITY): Payer: Medicare Other | Admitting: Physical Therapy

## 2022-02-15 DIAGNOSIS — M25662 Stiffness of left knee, not elsewhere classified: Secondary | ICD-10-CM

## 2022-02-15 DIAGNOSIS — M25562 Pain in left knee: Secondary | ICD-10-CM | POA: Diagnosis not present

## 2022-02-15 DIAGNOSIS — R2689 Other abnormalities of gait and mobility: Secondary | ICD-10-CM

## 2022-02-15 NOTE — Therapy (Signed)
OUTPATIENT PHYSICAL THERAPY TREATMENT   Patient Name: Rachel Wood MRN: [id number] DOB:1950/11/10, 71 y.o., female Today's Date: 02/15/2022  PHYSICAL THERAPY DISCHARGE SUMMARY  Visits from Start of Care: 12  Current functional level related to goals / functional outcomes: See below   Remaining deficits: See below   Education / Equipment: See assessment    Patient agrees to discharge. Patient goals were met. Patient is being discharged due to being pleased with the current functional level.    PT End of Session - 02/15/22 1427     Visit Number 12    Number of Visits 12    Date for PT Re-Evaluation 02/19/22    Authorization Type [employer] Medicare (no auth, no VL)    Progress Note Due on Visit 12    PT Start Time 1435    PT Stop Time 1505    PT Time Calculation (min) 30 min    Activity Tolerance Patient tolerated treatment well    Behavior During Therapy [facility] for tasks assessed/performed               Past Medical History:  Diagnosis Date   Arthritis    Cancer ([place]) 2004   Breast Cancer   Constipation    Depression    GERD (gastroesophageal reflux disease)    History of kidney stones    Hypercholesteremia    Hypertension    Osteoporosis    Personal history of chemotherapy 2004   Personal history of radiation therapy 2004   PONV (postoperative nausea and vomiting)    Stroke [facility])    While on Tamoxifen   Past Surgical History:  Procedure Laterality Date   CHOLECYSTECTOMY     COLONOSCOPY  12/24/2011   Procedure: COLONOSCOPY;  Surgeon: Danie Binder, MD;  [facility];  Service: Endoscopy;  Laterality: N/A;  8:30 AM   Left breast lumpectomy  2004   LYMPH GLAND EXCISION     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   Procedure: TOTAL KNEE ARTHROPLASTY;  Surgeon: Willaim Sheng, MD;  Location: [facility];  Service: Orthopedics;  Laterality: Left;   TRACHEOSTOMY     as a child   Patient Active Problem List   Diagnosis Date Noted    Gastroesophageal reflux disease without esophagitis 05/01/2018   Hypertension 03/07/2016   History of melanoma 12/20/2014   Hyperlipidemia 12/20/2014    PCP: Anastasia Pall MD   REFERRING PROVIDER: Willaim Sheng, MD   REFERRING DIAG: Post OP left knee Replacement   THERAPY DIAG:  Left knee pain, unspecified chronicity  Stiffness of left knee, not elsewhere classified  Other abnormalities of gait and mobility  Rationale for Evaluation and Treatment Rehabilitation  ONSET DATE: DOS: 12/27/21  SUBJECTIVE:   SUBJECTIVE STATEMENT: Patient reports significant improvement in function overall. She notes only remaining issue is slight stiffness in knee. Otherwise she is having no issues.   PERTINENT HISTORY: Lt TKA 12/27/21; RT TKA 2022  PAIN:  Are you having pain? No  PRECAUTIONS: None  WEIGHT BEARING RESTRICTIONS No  FALLS:  Has patient fallen in last 6 months? No  LIVING ENVIRONMENT: Lives with: lives with their spouse Lives in: House/apartment Stairs: Yes: External: 1 steps; none Has following equipment at home: Gilford Rile - 2 wheeled  OCCUPATION: Floral design   PLOF: Independent  PATIENT GOALS  Be able to work in my yard and be on my feet without pain   OBJECTIVE:   DIAGNOSTIC FINDINGS: NA  PATIENT SURVEYS:  FOTO 44% function  COGNITION:  Overall cognitive status: Within functional limits for tasks assessed     SENSATION: WFL  EDEMA:  Minimal   PALPATION: Min TTP   LOWER EXTREMITY ROM:  Active ROM (degrees) Right eval Left eval Left 01/10/22 Left 01/15/22 Left 01/18/22 Left 01/23/22 Left  02/01/22 Left 02/08/22 Left 02/15/22  Hip flexion           Hip extension           Hip abduction           Hip adduction           Hip internal rotation           Hip external rotation           Knee flexion  88 100 101 improves to 108 with slides 102 improves to 109 with heel slides AROM 105 AAROM 110 AROM 117 AAROM 121 120 120  Knee extension  -4  -3 0   0 0 0  Ankle dorsiflexion           Ankle plantarflexion           Ankle inversion           Ankle eversion            (Blank rows = not tested)  LOWER EXTREMITY MMT:  MMT Right eval Left eval RT 02/08/22 LT  02/08/22 Right 02/15/22  Left 02/15/22  Hip flexion _0 4+ 5 5  Hip extension   3+ 3+ 4 4  Hip abduction   4+ _1 Hip adduction        Hip internal rotation        Hip external rotation        Knee flexion _2 4+  Knee extension _3 4+ 5 4+  Ankle dorsiflexion _4 Ankle plantarflexion        Ankle inversion        Ankle eversion         (Blank rows = not tested)   FUNCTIONAL TESTS:  2 minute walk test: 475 feet no AD  GAIT: Distance walked: 475 feet Assistive device utilized: None Level of assistance: Complete Independence Comments: 2MWT    TODAY'S TREATMENT: 02/15/22 Reassess MMT AROM Stairs 7 inch 3 RT no rail, reciprocal  Leg press 3 plates 2 x 10 Knee extension 2 plates 2 x 10 Hamstring curl machine 4 plates 2 x 10   0/2/54 Bike seat 9 level 3 5 minutes Standing:  Lt hamstring curls 2X10   6" lateral step down eccentric lowering (heel first) 2X10   6" forward step ups 20X   Reciprocally with 1 HR 6"   Tandem 30" each LE lead   Tandem on foam 30" each LE lead   Vectors with 1 HHA on Lt 5X5"   Stand against wall and push up onto toes 20X   Single leg stance 3 x 10" holds Prone: hamstring curls Lt 2X10   Lt LE raise with knee flexion 10X   02/08/22  Reassess  Rec bike (seat 9) lv 3 6 min AROM warmup  AROM MMT 2MWT Stairs 7 inch 5 RT reciprocal NO HHA Standing toe raise at wall 2 x10 Single leg stance 3 x 10" holds (14 sec max on LLE)  Standing hip abduction/ extension RTB 10 x 3" each       PATIENT EDUCATION:  Education details:  on evaluation findings, POC and HEP 7/3 HEP Person educated: Patient Education method: Explanation Education comprehension: verbalized understanding   HOME EXERCISE  PROGRAM: EVAL: Reviewed post op handout, same as above Access Code: [email or web address] 02/08/22 - Hip Extension with Resistance Loop  - 1-2 x daily - 7 x weekly - 2 sets - 10 reps - 3 second hold - Single Leg Stance with Support  - 1-2 x daily - 7 x weekly - 1 sets - 5 reps - 15-20 second hold - Standing Heel Raise with Support  - 1-2 x daily - 7 x weekly - 2 sets - 10 reps - Toe Raise With Back Against Wall  - 1-2 x daily - 7 x weekly - 2 sets - 10 reps  7/3  - Supine Heel Slide with Strap  - 3 x daily - 7 x weekly - 10 reps - 10 second hold - Squat with Counter Support  - 1 x daily - 7 x weekly - 2 sets - 10 reps - Seated Long Arc Quad (Mirrored)  - 1 x daily - 7 x weekly - 10 reps - 5-10 second hold 7/20 - Hip Abduction with Resistance Loop  - 1 x daily - 7 x weekly - 2 sets - 10 reps - Sit to Stand with Arms Crossed  - 1 x daily - 7 x weekly - 3 sets - 10 reps  ASSESSMENT:  CLINICAL IMPRESSION: Patient has made very good progress and has currently met all goals except slight limitation in hip strength ongoing. Reviewed HEP and discussed transition to HEP and/ or exercise at commercial facility for continued strengthening. Educated patient on use of weight machines for LE strengthening. Reviewed HEP and answered all patient questions.  Encouraged patient to follow up with therapy services with any further questions or concerns.   OBJECTIVE IMPAIRMENTS Abnormal gait, decreased activity tolerance, decreased balance, decreased mobility, difficulty walking, decreased ROM, decreased strength, hypomobility, increased edema, increased fascial restrictions, impaired flexibility, improper body mechanics, and pain.   ACTIVITY LIMITATIONS lifting, bending, sitting, standing, squatting, sleeping, stairs, transfers, and locomotion level  PARTICIPATION LIMITATIONS: meal prep, cleaning, laundry, driving, shopping, community activity, occupation, and yard work  PERSONAL FACTORS  None  are also affecting  patient's functional outcome.   REHAB POTENTIAL: Good  CLINICAL DECISION MAKING: Stable/uncomplicated  EVALUATION COMPLEXITY: Low   GOALS: SHORT TERM GOALS: Target date: 02/05/2022  Patient will be independent with initial HEP and self-management strategies to improve functional outcomes Baseline:  Goal status: MET   LONG TERM GOALS: Target date: 02/19/2022  Patient will be independent with advanced HEP and self-management strategies to improve functional outcomes Baseline:  Goal status: MET  2.  Patient will report at least 80% overall improvement in subjective complaint to indicate improvement in ability to perform ADLs. Baseline: 95% Goal status: REVISED (original goal was FOTO score, but it appears patient was deleted from [place] mistakenly)   3.  Patient will have LT knee AROM 0-120 degrees to improve functional mobility and facilitate squatting to pick up items from floor. Baseline: 0-120 deg (after manual stretching)  Goal status: MET  4. Patient will have equal to or > 4+/5 MMT throughout  to improve ability to perform functional mobility, stair ambulation and ADLs.  Baseline: See MMT Goal status: partially MET (all but hip extension)     5. Patient will be able to ambulate at least 350 feet during 2MWT with LRAD to demonstrate improved ability to perform functional mobility and associated tasks.  Baseline: 475 feet no AD Goal status: MET PLAN: PT FREQUENCY: 2x/week  PT DURATION: 6 weeks  PLANNED INTERVENTIONS: Therapeutic exercises, Therapeutic activity, Neuromuscular re-education, Balance training, Gait training, Patient/Family education, Joint manipulation, Joint mobilization, Stair training, Aquatic Therapy, Dry Needling, Electrical stimulation, Spinal manipulation, Spinal mobilization, Cryotherapy, Moist heat, scar mobilization, Taping, Traction, Ultrasound, Biofeedback, Ionotophoresis 66m/ml Dexamethasone, and Manual therapy.   PLAN FOR NEXT SESSION: DC to HEP    3:06 PM, 02/15/22 CJosue HectorPT DPT  Physical [facility] ([phone]

## 2022-02-20 ENCOUNTER — Encounter (HOSPITAL_COMMUNITY): Payer: Medicare Other | Admitting: Physical Therapy

## 2022-02-22 ENCOUNTER — Encounter (HOSPITAL_COMMUNITY): Payer: Medicare Other

## 2022-02-27 ENCOUNTER — Encounter (HOSPITAL_COMMUNITY): Payer: Medicare Other | Admitting: Physical Therapy

## 2022-03-01 ENCOUNTER — Encounter (HOSPITAL_COMMUNITY): Payer: Medicare Other

## 2022-04-05 ENCOUNTER — Ambulatory Visit: Payer: Medicare Other | Admitting: Orthopaedic Surgery

## 2022-04-05 ENCOUNTER — Ambulatory Visit (INDEPENDENT_AMBULATORY_CARE_PROVIDER_SITE_OTHER): Payer: Medicare Other

## 2022-04-05 ENCOUNTER — Encounter: Payer: Self-pay | Admitting: Orthopaedic Surgery

## 2022-04-05 DIAGNOSIS — M5441 Lumbago with sciatica, right side: Secondary | ICD-10-CM | POA: Diagnosis not present

## 2022-04-05 DIAGNOSIS — G8929 Other chronic pain: Secondary | ICD-10-CM

## 2022-04-05 NOTE — Patient Instructions (Signed)
While we are working on your approval please go ahead and call to schedule your appointment with [facility]  **[place] [facility] AND YOU WANT TO HAVE IT DONE AT [facility] DON'T [place] Scheduling [phone]  AFTER you have made your imaging appointment, please call our office back at [phone] to schedule an appointment to review your results. Your follow up appointment will need to be 3-4 days after your imaging is performed to allow [place] time to get the report. If you are having your imaging performed in a facility not associated with [facility], you will need to ask for a CD with your images on them.

## 2022-04-05 NOTE — Progress Notes (Signed)
Subjective:    Patient ID: Rachel Wood, female    DOB: 04/10/1951, 71 y.o.   MRN: [id number]  HPI She has return of lower back pain and right sided sciatica.  I saw her over three years ago for this.  She had MRI and then epidural which really helped until recently.  She had total knee done on the left and had a block.  She said the pain of the back started shortly after the total knee.  She has right sided sciatica with pain to the right foot. It is not getting any better.  She has done the exercises she has done in the past with no help.  Medicine, heat, ice do not help.  She would like to have new epidural.  She will need MRI first.   Review of Systems  Constitutional:  Positive for activity change.  Musculoskeletal:  Positive for arthralgias and back pain.  All other systems reviewed and are negative. For Review of Systems, all other systems reviewed and are negative.  The following is a summary of the past history medically, past history surgically, known current medicines, social history and family history.  This information is gathered electronically by the computer from prior information and documentation.  I review this each visit and have found including this information at this point in the chart is beneficial and informative.   Past Medical History:  Diagnosis Date   Arthritis    Cancer ([place]) 2004   Breast Cancer   Constipation    Depression    GERD (gastroesophageal reflux disease)    History of kidney stones    Hypercholesteremia    Hypertension    Osteoporosis    Personal history of chemotherapy 2004   Personal history of radiation therapy 2004   PONV (postoperative nausea and vomiting)    Stroke [facility])    While on Tamoxifen    Past Surgical History:  Procedure Laterality Date   CHOLECYSTECTOMY     COLONOSCOPY  12/24/2011   Procedure: COLONOSCOPY;  Surgeon: Danie Binder, MD;  [facility];  Service: Endoscopy;  Laterality: N/A;  8:30 AM    Left breast lumpectomy  2004   LYMPH GLAND EXCISION     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   Procedure: TOTAL KNEE ARTHROPLASTY;  Surgeon: Willaim Sheng, MD;  Location[facility];  Service: Orthopedics;  Laterality: Left;   TRACHEOSTOMY     as a child    Current Outpatient Medications on File Prior to Visit  Medication Sig Dispense Refill   amLODipine (NORVASC) 5 MG tablet Take 5 mg by mouth daily.     fenofibrate 160 MG tablet Take 160 mg by mouth daily.     omeprazole (PRILOSEC) 20 MG capsule Take 20 mg by mouth daily.     PARoxetine (PAXIL) 10 MG tablet Take 10 mg by mouth every morning.      Probiotic Product (PROBIOTIC DAILY PO) Take 1 capsule by mouth daily. Women's 40 Billion     rosuvastatin (CRESTOR) 5 MG tablet Take 5 mg by mouth every Monday, Wednesday, and Friday.     valsartan-hydrochlorothiazide (DIOVAN-HCT) 320-12.5 MG tablet Take 1 tablet by mouth daily.     vitamin B-12 (CYANOCOBALAMIN) 1000 MCG tablet Take 1,000 mcg by mouth daily.     No current facility-administered medications on file prior to visit.    Social History   Socioeconomic History   Marital status: Married    Spouse name: Not on file  Number of children: Not on file   Years of education: Not on file   Highest education level: Not on file  Occupational History   Not on file  Tobacco Use   Smoking status: Never   Smokeless tobacco: Never  Vaping Use   Vaping Use: Never used  Substance and Sexual Activity   Alcohol use: No   Drug use: No   Sexual activity: Yes    Birth control/protection: None  Other Topics Concern   Not on file  Social History Narrative   Not on file   Social Determinants of Health   Financial Resource Strain: Low Risk  (08/17/2021)   Overall Financial Resource Strain (CARDIA)    Difficulty of Paying Living Expenses: Not hard at all  Food Insecurity: No Food Insecurity (08/17/2021)   Hunger Vital Sign    Worried About Running Out of Food in the Last Year: Never  true    [place] in the Last Year: Never true  Transportation Needs: No Transportation Needs (08/17/2021)   PRAPARE - Hydrologist (Medical): No    Lack of Transportation (Non-Medical): No  Physical Activity: Insufficiently Active (08/17/2021)   Exercise Vital Sign    Days of Exercise per Week: 4 days    Minutes of Exercise per Session: 20 min  Stress: No Stress Concern Present (08/17/2021)   [place]    Feeling of Stress : Not at all  Social Connections: Moderately Integrated (08/17/2021)   Social Connection and Isolation Panel [NHANES]    Frequency of Communication with Friends and Family: More than three times a week    Frequency of Social Gatherings with Friends and Family: Three times a week    Attends Religious Services: More than 4 times per year    Active Member of Clubs or Organizations: No    Attends Archivist Meetings: Never    Marital Status: Married  Human resources officer Violence: Not At Risk (08/17/2021)   Humiliation, Afraid, Rape, and Kick questionnaire    Fear of Current or Ex-Partner: No    Emotionally Abused: No    Physically Abused: No    Sexually Abused: No    Family History  Problem Relation Age of Onset   Heart disease Mother    Hypertension Mother    Osteoporosis Mother     There were no vitals taken for this visit.  There is no height or weight on file to calculate BMI.      Objective:   Physical Exam Vitals and nursing note reviewed. Exam conducted with a chaperone present.  Constitutional:      Appearance: Normal appearance. She is well-developed.  HENT:     Head: Normocephalic and atraumatic.  Eyes:     Conjunctiva/sclera: Conjunctivae normal.     Pupils: Pupils are equal, round, and reactive to light.  Cardiovascular:     Rate and Rhythm: Normal rate and regular rhythm.  Pulmonary:     Effort: Pulmonary effort is normal.  Abdominal:      Palpations: Abdomen is soft.  Musculoskeletal:       Arms:     Cervical back: Normal range of motion and neck supple.  Skin:    General: Skin is warm and dry.  Neurological:     Mental Status: She is alert and oriented to person, place, and time.     Cranial Nerves: No cranial nerve deficit.  Motor: No abnormal muscle tone.     Coordination: Coordination normal.     Deep Tendon Reflexes: Reflexes are normal and symmetric. Reflexes normal.  Psychiatric:        Behavior: Behavior normal.        Thought Content: Thought content normal.        Judgment: Judgment normal.   X-rays were done of the lumbar spine, reported separately.          Assessment & Plan:   Encounter Diagnosis  Name Primary?   Chronic right-sided low back pain with right-sided sciatica Yes   I will get MRI of the lumbar spine and then refer for new epidurals.  Return after MRIs.  Call if any problem.  Precautions discussed.  Electronically Signed Sanjuana Kava, MD 9/21/202310:33 AM

## 2022-04-06 ENCOUNTER — Ambulatory Visit (HOSPITAL_BASED_OUTPATIENT_CLINIC_OR_DEPARTMENT_OTHER)
Admission: RE | Admit: 2022-04-06 | Discharge: 2022-04-06 | Disposition: A | Discharge status: Home / Self Care | Payer: Medicare Other | Source: Ambulatory Visit | Attending: Orthopaedic Surgery | Admitting: Orthopaedic Surgery

## 2022-04-06 DIAGNOSIS — G8929 Other chronic pain: Secondary | ICD-10-CM | POA: Diagnosis present

## 2022-04-06 DIAGNOSIS — N83202 Unspecified ovarian cyst, left side: Secondary | ICD-10-CM | POA: Diagnosis not present

## 2022-04-06 DIAGNOSIS — M5441 Lumbago with sciatica, right side: Secondary | ICD-10-CM | POA: Insufficient documentation

## 2022-04-06 DIAGNOSIS — M4316 Spondylolisthesis, lumbar region: Secondary | ICD-10-CM | POA: Diagnosis not present

## 2022-04-09 ENCOUNTER — Telehealth: Payer: Self-pay | Admitting: Radiology

## 2022-04-09 NOTE — Telephone Encounter (Signed)
Patient called and asked if we could tell her the results of her MRI.  She was struggling to get MyChart activated.  I reviewed with her the impression of the MRI.  She does not currently have a GYN, and wants to discuss you ordering the pelvic US and referral to GYN.  FYI

## 2022-04-11 ENCOUNTER — Ambulatory Visit: Payer: Medicare Other | Admitting: Orthopaedic Surgery

## 2022-04-11 ENCOUNTER — Encounter: Payer: Self-pay | Admitting: Orthopaedic Surgery

## 2022-04-11 ENCOUNTER — Other Ambulatory Visit: Payer: Self-pay | Admitting: Orthopaedic Surgery

## 2022-04-11 DIAGNOSIS — G8929 Other chronic pain: Secondary | ICD-10-CM | POA: Diagnosis not present

## 2022-04-11 DIAGNOSIS — M5441 Lumbago with sciatica, right side: Secondary | ICD-10-CM | POA: Diagnosis not present

## 2022-04-11 NOTE — Progress Notes (Signed)
I still have some right sciatica.  She had the MRI of the lumbar spine which showed: IMPRESSION: 1. Incidental left ovarian/adnexal cyst which only measures 2 cm but has a small mural nodular appearance, recommend pelvic ultrasound follow-up. 2. Stable lumbar spine degeneration since 2020, primarily affecting the L4-5 facets and L5-S1 disc space. No neural compression.  I have explained the findings to her.  She will see Dr. Elonda Husky about the ovarian cyst on the MRI findings.  I will have her get an epidural at [facility].  She had one three years ago and did well from that.  I have independently reviewed the MRI.      Spine/Pelvis examination:  Inspection:  Overall, sacoiliac joint benign and hips nontender; without crepitus or defects.   Thoracic spine inspection: Alignment normal without kyphosis present   Lumbar spine inspection:  Alignment  with normal lumbar lordosis, without scoliosis apparent.   Thoracic spine palpation:  without tenderness of spinal processes   Lumbar spine palpation: without tenderness of lumbar area; without tightness of lumbar muscles    Range of Motion:   Lumbar flexion, forward flexion is normal without pain or tenderness    Lumbar extension is full without pain or tenderness   Left lateral bend is normal without pain or tenderness   Right lateral bend is normal without pain or tenderness   Straight leg raising is normal  Strength & tone: normal   Stability overall normal stability  Encounter Diagnosis  Name Primary?   Chronic right-sided low back pain with right-sided sciatica Yes   Return in six weeks.  Call if any problem.  Precautions discussed.  Electronically Signed Sanjuana Kava, MD 9/27/20239:58 AM

## 2022-04-12 ENCOUNTER — Ambulatory Visit: Payer: Medicare Other | Admitting: Obstetrics & Gynecology

## 2022-04-12 ENCOUNTER — Encounter: Payer: Self-pay | Admitting: Obstetrics & Gynecology

## 2022-04-12 VITALS — BP 138/76 | HR 63 | Ht 63.0 in | Wt 155.0 lb

## 2022-04-12 DIAGNOSIS — N83202 Unspecified ovarian cyst, left side: Secondary | ICD-10-CM

## 2022-04-12 NOTE — Progress Notes (Signed)
Follow up appointment for results: 2 cm cyst found on recent MRI  Chief Complaint  Patient presents with   Follow-up    Ovarian cyst seen on MRI at [facility]    Blood pressure 138/76, pulse 63, height '5\' 3"'$  (1.6 m), weight 155 lb (70.3 kg).   Study Result  Narrative & Impression  CLINICAL DATA:  Low back pain with symptoms persisting over 6 weeks of treatment. Chronic right-sided back pain radiating down the right leg for 3 months   EXAM: MRI LUMBAR SPINE WITHOUT CONTRAST   TECHNIQUE: Multiplanar, multisequence MR imaging of the lumbar spine was performed. No intravenous contrast was administered.   COMPARISON:  03/18/2019   FINDINGS: Segmentation:  Standard.   Alignment:  Chronic mild anterolisthesis at L4-5   Vertebrae:  No fracture, evidence of discitis, or bone lesion.   Conus medullaris and cauda equina: Conus extends to the L1 level. Conus and cauda equina appear normal.   Paraspinal and other soft tissues: Negative for perispinal mass or inflammation. 2 cm left ovarian/adnexal cyst with small nodular component measuring 5 mm.   Disc levels:   T12- L1 to L3-L4: Mild disc bulging and small L3-4 facet spurs.   L4-L5: Moderate facet spurring with mild anterolisthesis. Disc space narrowing with posterior annular fissure.   L5-S1:Intervertebral collapse and ankylosis. Mild facet spurring. No neural impingement.   These results will be called to the ordering clinician or representative by the Radiologist Assistant, and communication documented in the PACS or [employer].   IMPRESSION: 1. Incidental left ovarian/adnexal cyst which only measures 2 cm but has a small mural nodular appearance, recommend pelvic ultrasound follow-up. 2. Stable lumbar spine degeneration since 2020, primarily affecting the L4-5 facets and L5-S1 disc space. No neural compression.     Electronically Signed   By: Jorje Guild M.D.   On: 04/09/2022 06:01  I looked at  the MRI report and images from 2020 and the cyst was not mentioned However they did not go low enough on the scan and so there was no mention of it at all because  it did not appear on the scan  I then kept digging into the previous scans and the only previous one that would be applicable was a December 22, 2002 CT scan of the abdomen and pelvis  Sure enough on the CT scan there was a 2.5 cm left ovarian cyst The patient was not aware of this That report is below: Study Result  Narrative  FINDINGS  CLINICAL DATA:  71 YEAR OLD WITH HISTORY OF BREAST CANCER.  STATUS POST LUMPECTOMY AND AXILLARY  NODE DISSECTION.  PATIENT HAS NOT STARTED TREATMENTS.  CT CHEST, ABDOMEN, AND PELVIS WITH CONTRAST  HELICAL IMAGES ARE PERFORMED THROUGH THE CHEST, ABDOMEN, AND PELVIS FOLLOWING ADMINISTRATION OF  ORAL CONTRAST AND DURING ADMINISTRATION OF 150 CC OMNIPAQUE 300.  CHEST  POSTOPERATIVE CHANGE IS SEEN WITHIN THE LEFT BREAST AND LEFT AXILLA.  THERE IS NO MEDIASTINAL,  HILAR, OR AXILLARY ADENOPATHY.  THERE ARE NO PULMONARY NODULES, PLEURAL EFFUSIONS, OR INFILTRATES.  BONE WINDOWS SHOW MINIMAL DEGENERATIVE CHANGES.  IMPRESSION  POSTOPERATIVE CHANGES CONSISTENT WITH LUMPECTOMY AND AXILLARY NODE DISSECTION.  THERE IS NO  EVIDENCE FOR THORACIC METASTASES.  ABDOMEN  NO FOCAL ABNORMALITY IS SEEN WITHIN THE LIVER, SPLEEN, PANCREAS, ADRENAL GLANDS, OR KIDNEYS.  THE  GALLBLADDER IS SURGICALLY ABSENT.  THERE IS NO RETROPERITONEAL ADENOPATHY OR ASCITES.  IMPRESSION  NO EVIDENCE FOR ACUTE ABNORMALITY OF THE ABDOMEN.  PELVIS  UTERUS IS PRESENT.  CENTRALLY, THERE IS LOW ATTENUATION WITHIN THE UTERUS WHICH IS CONSIDERED  ABNORMAL FOR A POSTMENOPAUSAL FEMALE.  CORRELATION IS RECOMMENDED WITH PATIENT'S MENOPAUSAL STATUS.   WITHIN THE LEFT ADNEXAL REGION, THERE IS A 2.5 CM PROBABLE CYST AND SMALLER CYST IS SEEN WITHIN  THE RIGHT OVARY.  FOLLOW-UP PELVIC ULTRASOUND COULD BE HELPFUL FOR FURTHER EVALUATION.  THERE IS NO   PELVIC ADENOPATHY OR FREE PELVIC FLUID.  PELVIC BOWEL LOOPS HAVE A NORMAL APPEARANCE.  THE APPENDIX  IS NOT SEEN.  IMPRESSION  CENTRAL LOW ATTENUATION WITHIN THE UTERUS CONSISTENT WITH THICKENED ENDOMETRIAL LINING VERSUS  FLUID.  EITHER WOULD BE CONSIDERED ABNORMAL IN A POSTMENOPAUSAL FEMALE AND CORRELATION IS  RECOMMENDED WITH HER MENOPAUSAL STATUS.  THERE IS PROBABLE LEFT OVARIAN CYST.  PELVIC ULTRASOUND  COULD BE HELPFUL FOR FURTHER EVALUATION.  I CALLED THE FINDINGS TO DR. Julien Girt VOICE MAIL IN HIS ABSENCE.     [facility] ordered this encounter: No orders of the defined types were placed in this encounter.   Orders for this encounter: Orders Placed This Encounter  Procedures   Ovarian Malignancy Risk-ROMA    Impression + Management Plan   ICD-10-CM   1. Left ovarian cyst, benign and stable  N83.202 Ovarian Malignancy Risk-ROMA   Benign appearing cyst stable since 12/2002.  Out of an abundance of caution because I can tell the patient would like to, ROMA is drawn      Follow Up:I will message her with the interpretation of the ROMA Return if symptoms worsen or fail to improve.     All questions were answered.  Past Medical History:  Diagnosis Date   Arthritis    Cancer ([place]) 2004   Breast Cancer   Constipation    Depression    GERD (gastroesophageal reflux disease)    History of kidney stones    Hypercholesteremia    Hypertension    Osteoporosis    Personal history of chemotherapy 2004   Personal history of radiation therapy 2004   PONV (postoperative nausea and vomiting)    Stroke [facility])    While on Tamoxifen    Past Surgical History:  Procedure Laterality Date   CHOLECYSTECTOMY     COLONOSCOPY  12/24/2011   Procedure: COLONOSCOPY;  Surgeon: Danie Binder, MD;  Location: [facility];  Service: Endoscopy;  Laterality: N/A;  8:30 AM   Left breast lumpectomy  2004   LYMPH GLAND EXCISION     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   Procedure: TOTAL KNEE  ARTHROPLASTY;  Surgeon: Willaim Sheng, MD;  Location: [facility];  Service: Orthopedics;  Laterality: Left;   TRACHEOSTOMY     as a child    OB History     Gravida  4   Para  1   Term  1   Preterm      AB  3   Living  1      SAB      IAB      Ectopic      Multiple      Live Births              Allergies  Allergen Reactions   Codeine Nausea And Vomiting    Needs pre meds   Amoxicillin Other (See Comments)    Gave her thrush   Oxycodone Nausea And Vomiting    Social History   Socioeconomic History   Marital status: Married    Spouse name: Not on file   Number of children: Not  on file   Years of education: Not on file   Highest education level: Not on file  Occupational History   Not on file  Tobacco Use   Smoking status: Never   Smokeless tobacco: Never  Vaping Use   Vaping Use: Never used  Substance and Sexual Activity   Alcohol use: No   Drug use: No   Sexual activity: Yes    Birth control/protection: None  Other Topics Concern   Not on file  Social History Narrative   Not on file   Social Determinants of Health   Financial Resource Strain: Low Risk  (08/17/2021)   Overall Financial Resource Strain (CARDIA)    Difficulty of Paying Living Expenses: Not hard at all  Food Insecurity: No Food Insecurity (08/17/2021)   Hunger Vital Sign    Worried About Running Out of Food in the Last Year: Never true    [place] in the Last Year: Never true  Transportation Needs: No Transportation Needs (08/17/2021)   PRAPARE - Hydrologist (Medical): No    Lack of Transportation (Non-Medical): No  Physical Activity: Insufficiently Active (08/17/2021)   Exercise Vital Sign    Days of Exercise per Week: 4 days    Minutes of Exercise per Session: 20 min  Stress: No Stress Concern Present (08/17/2021)   [place]    Feeling of Stress : Not at all  Social  Connections: Moderately Integrated (08/17/2021)   Social Connection and Isolation Panel [NHANES]    Frequency of Communication with Friends and Family: More than three times a week    Frequency of Social Gatherings with Friends and Family: Three times a week    Attends Religious Services: More than 4 times per year    Active Member of Clubs or Organizations: No    Attends Archivist Meetings: Never    Marital Status: Married    Family History  Problem Relation Age of Onset   Heart disease Mother    Hypertension Mother    Osteoporosis Mother

## 2022-04-13 LAB — PREMENOPAUSAL INTERP: HIGH

## 2022-04-13 LAB — OVARIAN MALIGNANCY RISK-ROMA
Cancer Antigen (CA) 125: 9.1 U/mL (ref 0.0–38.1)
HE4: 94.7 pmol/L (ref 0.0–96.5)
Postmenopausal ROMA: 1.49
Premenopausal ROMA: 2.63 — ABNORMAL HIGH

## 2022-04-13 LAB — POSTMENOPAUSAL INTERP: LOW

## 2022-04-20 ENCOUNTER — Ambulatory Visit
Admission: RE | Admit: 2022-04-20 | Discharge: 2022-04-20 | Disposition: A | Discharge status: Home / Self Care | Payer: Medicare Other | Source: Ambulatory Visit | Attending: Orthopaedic Surgery | Admitting: Orthopaedic Surgery

## 2022-04-20 DIAGNOSIS — G8929 Other chronic pain: Secondary | ICD-10-CM

## 2022-04-20 MED ORDER — IOPAMIDOL (ISOVUE-M 200) INJECTION 41%
1.0000 mL | Freq: Once | INTRAMUSCULAR | Status: AC
  Administered 2022-04-20: 1 mL via EPIDURAL

## 2022-04-20 MED ORDER — METHYLPREDNISOLONE ACETATE 40 MG/ML INJ SUSP (RADIOLOG
80.0000 mg | Freq: Once | INTRAMUSCULAR | Status: AC
  Administered 2022-04-20: 80 mg via EPIDURAL

## 2022-04-20 NOTE — Discharge Instructions (Signed)

## 2022-05-24 ENCOUNTER — Ambulatory Visit: Payer: Medicare Other | Admitting: Orthopaedic Surgery

## 2022-05-24 ENCOUNTER — Encounter: Payer: Self-pay | Admitting: Orthopaedic Surgery

## 2022-05-24 VITALS — Ht 63.0 in | Wt 155.0 lb

## 2022-05-24 DIAGNOSIS — G8929 Other chronic pain: Secondary | ICD-10-CM | POA: Diagnosis not present

## 2022-05-24 DIAGNOSIS — M5441 Lumbago with sciatica, right side: Secondary | ICD-10-CM

## 2022-05-24 NOTE — Progress Notes (Signed)
I am better.  She is much improved. Her lower back pain is almost gone.  She saw Dr. Elonda Husky about the ovarian cyst.  She has normal lower back exam.  Encounter Diagnosis  Name Primary?   Chronic right-sided low back pain with right-sided sciatica Yes   See prn.  Call if any problem.  Precautions discussed.  Electronically Signed Sanjuana Kava, MD 11/9/202310:54 AM

## 2022-05-24 NOTE — Patient Instructions (Signed)
As the weather changes and gets cooler, you may notice you are affected more. You may have more pain in your joints. This is normal. Dress warmly and make sure that area is covered well.   WE WILL SEE YOU AS WE NEED TO.   Dr.Keeling is here all day on Tuesdays, Wednesday mornings, and Thursday mornings. If you need anything such as a medication refill, please either call BEFORE the end of the day on [facility] or send a message through [place]. Your pharmacy can send a refill request for you. Calling by the end of the day on [facility] allows [place] time to send Dr.Keeling the request and for him to respond before he leaves on Thursdays.  If Dr. Luna Glasgow is out of the office, we may send it to one of the other providers and they may not refill it for the same amount that your original prescription is for.

## 2022-06-19 ENCOUNTER — Ambulatory Visit (INDEPENDENT_AMBULATORY_CARE_PROVIDER_SITE_OTHER): Payer: Medicare Other

## 2022-06-19 ENCOUNTER — Ambulatory Visit: Payer: Medicare Other | Admitting: Orthopaedic Surgery

## 2022-06-19 ENCOUNTER — Encounter: Payer: Self-pay | Admitting: Orthopaedic Surgery

## 2022-06-19 DIAGNOSIS — M25522 Pain in left elbow: Secondary | ICD-10-CM | POA: Diagnosis not present

## 2022-06-19 DIAGNOSIS — M5442 Lumbago with sciatica, left side: Secondary | ICD-10-CM

## 2022-06-19 DIAGNOSIS — W19XXXA Unspecified fall, initial encounter: Secondary | ICD-10-CM

## 2022-06-19 MED ORDER — DIAZEPAM 2 MG PO TABS: ORAL_TABLET | ORAL | 0 refills | Status: AC

## 2022-06-19 MED ORDER — TRAMADOL HCL 50 MG PO TABS: 50.0000 mg | ORAL_TABLET | Freq: Four times a day (QID) | ORAL | 0 refills | Status: AC | PRN

## 2022-06-19 NOTE — Progress Notes (Signed)
I fell  She slipped on a plastic top to a container on Thursday, November 30 while decorating her hearth.  She fell against the hearth and hurt her left elbow and lower back.  Her elbow has been bothering her more until today and now it is her side and lower back that hurts bad.  She had o other injury.  She has used Advil, ice and rest but they have not helped that much.  She is very painful on the left lower chest and lower back.  She has no spasm now but has had spasm.  Breathing is good.  She has contusion present.  Left elbow has some tenderness but full ROM.  NV intact. DTRs normal.  Encounter Diagnoses  Name Primary?   Fall, initial encounter Yes   Pain in left elbow    Acute left-sided low back pain with left-sided sciatica    X-rays were done of the left elbow and lumbar spine, reported separately.  No fracture was noted.  I will give Toradol and valium 2.  Precautions given.  She will need help to go to bathroom.  Return in two days.  She may need MRI.  Call if any problem.  Precautions discussed.  Electronically Signed Sanjuana Kava, MD 12/5/20239:11 AM

## 2022-06-19 NOTE — Patient Instructions (Addendum)
You are being prescribed Valium for muscle spasms. This medicine will make you drowsy so make sure you have help getting up and around so you don't fall. Take this medicine as directed even if it doesn't completely relieve the muscle spasms. If you are not having spasms, you don't need to take the medicine.   Follow up in 2 days for recheck.   Pain medicine take every 4 hours as needed.  Both of these medicines cause drowsiness. Use extreme caution when doing any activity. Let someone help you when you need to get up or go to the restroom.   LET us KNOW IF YOU CAN'T GET HERE ON THURSDAY FOR YOUR FOLLOW UP CALL us AND LET us KNOW.   Ph [phone]

## 2022-06-21 ENCOUNTER — Ambulatory Visit: Payer: Medicare Other | Admitting: Orthopaedic Surgery

## 2022-06-21 ENCOUNTER — Telehealth: Payer: Self-pay

## 2022-06-21 NOTE — Telephone Encounter (Signed)
Called patient to see how she was feeling. Patient had a fall on 06/14/22 and injured her LT elbow and LT lower back. She was unable to come into the office today for her 2 day follow up appt due to pain/muscle spasms.   She stated that she is feeling better but the muscle spasms can still be pretty bad even with her taking Valium 2 mg q6h as directed. She stated that the pain is better. It is the muscle spasms causing the most discomfort. Patient is aware provider is out of the office until Tuesday. I advised her I would send him a message about our conversation. She was grateful for the phone call to follow up.

## 2022-06-22 NOTE — Telephone Encounter (Signed)
Spoke with patient and advised her of providers recommendations. Patient scheduled to come back in for follow up on 06/26/22

## 2022-06-26 ENCOUNTER — Encounter: Payer: Self-pay | Admitting: Orthopaedic Surgery

## 2022-06-26 ENCOUNTER — Ambulatory Visit: Payer: Medicare Other | Admitting: Orthopaedic Surgery

## 2022-06-26 VITALS — BP 134/80 | HR 64 | Ht 63.0 in | Wt 155.0 lb

## 2022-06-26 DIAGNOSIS — G8929 Other chronic pain: Secondary | ICD-10-CM | POA: Diagnosis not present

## 2022-06-26 DIAGNOSIS — W19XXXD Unspecified fall, subsequent encounter: Secondary | ICD-10-CM | POA: Diagnosis not present

## 2022-06-26 DIAGNOSIS — M5441 Lumbago with sciatica, right side: Secondary | ICD-10-CM

## 2022-06-26 NOTE — Patient Instructions (Addendum)
Tramadol is for your pain. Take that as directed  Valium is for your muscle spasms.   Central Scheduling [phone]  While we are working on your approval please go ahead and call to schedule your appointment to be done within one week. If you can not get an appointment at [facility] within the next week, ask if they have something sooner at [facility] or [place] if you are able to go to [place] to have the imaging done.  AFTER you have made your imaging appointment, please call our office back at [phone] to schedule an appointment to review your results.  CURRENTLY [employer] ONLY AUTHORIZES IMAGING TO BE PERFORMED AT [facility]. THEY WILL NOT APPROVE ANY OTHER FACILITY  [facility] Address: [address], [place], [place] [address] Phone: [phone]   [place] [address], [place] [address] Phone: [phone]

## 2022-06-26 NOTE — Progress Notes (Signed)
I am a little better.  She has had a rough week with her back.  Her pain is better now. She has stopped the Valium.  She is taking Tylenol for pain.  She wants to return to work Thursday but I am concerned she may not do well.  Her back is very tender, more lower thoracic and upper lumbar, no spasm.  ROM is limited and she is in pain.  NV intact. Gait is normal.  Muscle tone and strength normal.  No weakness.  Encounter Diagnoses  Name Primary?   Chronic right-sided low back pain with right-sided sciatica Yes   Fall, subsequent encounter    I will get MRI of the back.  Return in one week.  She can try to go back to work but I have not recommended it.  Call if any problem.  Precautions discussed.  Electronically Signed Sanjuana Kava, MD 12/12/20239:25 AM

## 2022-07-04 ENCOUNTER — Ambulatory Visit: Payer: Medicare Other | Admitting: Orthopaedic Surgery

## 2022-07-04 ENCOUNTER — Encounter: Payer: Self-pay | Admitting: Orthopaedic Surgery

## 2022-07-04 VITALS — BP 134/73 | HR 69 | Ht 63.0 in | Wt 155.0 lb

## 2022-07-04 DIAGNOSIS — M25522 Pain in left elbow: Secondary | ICD-10-CM | POA: Diagnosis not present

## 2022-07-04 DIAGNOSIS — M5441 Lumbago with sciatica, right side: Secondary | ICD-10-CM

## 2022-07-04 DIAGNOSIS — W19XXXD Unspecified fall, subsequent encounter: Secondary | ICD-10-CM | POA: Diagnosis not present

## 2022-07-04 DIAGNOSIS — G8929 Other chronic pain: Secondary | ICD-10-CM | POA: Diagnosis not present

## 2022-07-04 MED ORDER — TRAMADOL HCL 50 MG PO TABS: ORAL_TABLET | ORAL | 3 refills | Status: AC

## 2022-07-04 NOTE — Patient Instructions (Signed)
 Dr.Keeling is here all day on Tuesdays. He is here half a day on Wednesday mornings, and Thursday mornings. If you need anything such as a medication refill, please either call BEFORE the end of the day on [facility] or send a message through [place]. Your pharmacy can send a refill request for you. Calling by the end of the day on [facility] allows [place] time to send Dr.Keeling the request and for him to respond before he leaves on Thursdays.   My name is  and I assist [place]. If you need anything before your next appointment, please do not hesitate to call the office at [phone] and ask to leave a message for me. I will respond within 24-48 business hours. IF YOU HAVE MYCHART, THIS IS USUALLY THE FASTEST WAY TO GET A MESSAGE TO ME.   BE CAREFUL DOING ANY TYPE OF BENDING, LIFTING ETC. ASK FOR HELP!!!!!! YOU NEVER KNOW WHAT WILL CAUSE A FLARE UP OF YOUR BACK PAIN!!

## 2022-07-04 NOTE — Progress Notes (Signed)
My back hurts.  She has lower back pain that is slightly better.  She is scheduled for MRI on 07-13-22.  She is almost out of her pain medicine.  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  Her back pain is "tolerable" but still there.  She has no weakness, no new trauma.  Her back is tender, motion decreased, NV intact, muscle tone and strength normal.  Her elbow is about the same, slightly better, full ROM.  NV intact.  Encounter Diagnoses  Name Primary?   Chronic right-sided low back pain with right-sided sciatica Yes   Fall, subsequent encounter    Pain in left elbow    Get the MRI.  I have reviewed the [place] web site prior to prescribing narcotic medicine for this patient.  Return in two weeks.  Call if any problem.  Precautions discussed.  Electronically Signed Sanjuana Kava, MD 12/20/20238:41 AM

## 2022-07-13 ENCOUNTER — Ambulatory Visit (HOSPITAL_COMMUNITY)
Admission: RE | Admit: 2022-07-13 | Discharge: 2022-07-13 | Disposition: A | Discharge status: Home / Self Care | Payer: Medicare Other | Source: Ambulatory Visit | Attending: Orthopaedic Surgery | Admitting: Orthopaedic Surgery

## 2022-07-13 DIAGNOSIS — M5441 Lumbago with sciatica, right side: Secondary | ICD-10-CM | POA: Diagnosis present

## 2022-07-13 DIAGNOSIS — M47816 Spondylosis without myelopathy or radiculopathy, lumbar region: Secondary | ICD-10-CM | POA: Insufficient documentation

## 2022-07-13 DIAGNOSIS — G8929 Other chronic pain: Secondary | ICD-10-CM | POA: Diagnosis not present

## 2022-07-13 DIAGNOSIS — W19XXXD Unspecified fall, subsequent encounter: Secondary | ICD-10-CM | POA: Diagnosis not present

## 2022-07-17 ENCOUNTER — Encounter: Payer: Self-pay | Admitting: Orthopaedic Surgery

## 2022-07-17 ENCOUNTER — Ambulatory Visit: Payer: Medicare Other | Admitting: Orthopaedic Surgery

## 2022-07-17 DIAGNOSIS — M5441 Lumbago with sciatica, right side: Secondary | ICD-10-CM

## 2022-07-17 DIAGNOSIS — W19XXXD Unspecified fall, subsequent encounter: Secondary | ICD-10-CM | POA: Diagnosis not present

## 2022-07-17 DIAGNOSIS — G8929 Other chronic pain: Secondary | ICD-10-CM

## 2022-07-17 NOTE — Patient Instructions (Signed)
The cold weather will increase your back pain, try to be as active as you can, try to find somewhere you can walk indoors while it is cold outside. Try to walk about 3 days a week See you in a month

## 2022-07-17 NOTE — Progress Notes (Signed)
I am a little better.  She has less lower back pain.  She has no new trauma.  She has no weakness.  MRI was done of the lumbar spine which showed: IMPRESSION: 1. No acute abnormality. 2. Unchanged overall mild multilevel lumbar spondylosis without stenosis or impingement.   I have explained the findings to her.  I have independently reviewed the MRI.    She has less pain but still diffusely tender of the lumbar spine, no spasm,  NV intact, muscle tone and strength normal.  Encounter Diagnoses  Name Primary?   Chronic right-sided low back pain with right-sided sciatica Yes   Fall, subsequent encounter    Return in one month.  Continue walking exercises.  Call if any problem.  Precautions discussed.  Electronically Signed Sanjuana Kava, MD 1/2/20248:50 AM

## 2022-08-15 ENCOUNTER — Ambulatory Visit: Payer: Medicare Other | Admitting: Orthopaedic Surgery

## 2022-09-13 ENCOUNTER — Encounter: Payer: Self-pay | Admitting: Radiology

## 2022-09-17 ENCOUNTER — Other Ambulatory Visit (HOSPITAL_COMMUNITY): Payer: Self-pay | Admitting: Family Medicine

## 2022-09-17 DIAGNOSIS — Z1231 Encounter for screening mammogram for malignant neoplasm of breast: Secondary | ICD-10-CM

## 2022-09-19 ENCOUNTER — Ambulatory Visit (HOSPITAL_COMMUNITY)
Admission: RE | Admit: 2022-09-19 | Discharge: 2022-09-19 | Disposition: A | Discharge status: Home / Self Care | Payer: Medicare Other | Source: Ambulatory Visit | Attending: Family Medicine | Admitting: Family Medicine

## 2022-09-19 ENCOUNTER — Encounter (HOSPITAL_COMMUNITY): Payer: Self-pay

## 2022-09-19 DIAGNOSIS — Z1231 Encounter for screening mammogram for malignant neoplasm of breast: Secondary | ICD-10-CM | POA: Insufficient documentation

## 2023-01-19 ENCOUNTER — Ambulatory Visit
Admission: RE | Admit: 2023-01-19 | Discharge: 2023-01-19 | Disposition: A | Discharge status: Home / Self Care | Payer: Medicare Other | Source: Ambulatory Visit

## 2023-01-19 VITALS — BP 140/68 | HR 88 | Temp 98.2°F | Resp 18

## 2023-01-19 DIAGNOSIS — J069 Acute upper respiratory infection, unspecified: Secondary | ICD-10-CM

## 2023-01-19 MED ORDER — DOXYCYCLINE HYCLATE 100 MG PO TABS: 100.0000 mg | ORAL_TABLET | Freq: Two times a day (BID) | ORAL | 0 refills | Status: AC

## 2023-01-19 MED ORDER — FLUTICASONE PROPIONATE 50 MCG/ACT NA SUSP: 2.0000 | Freq: Every day | NASAL | 0 refills | Status: AC

## 2023-01-19 MED ORDER — CETIRIZINE HCL 10 MG PO TABS: 10.0000 mg | ORAL_TABLET | Freq: Every day | ORAL | 0 refills | Status: AC

## 2023-01-19 NOTE — ED Provider Notes (Signed)
[facility]    CSN: [id number] Arrival date & time: 01/19/23  1339      History   Chief Complaint Chief Complaint  Patient presents with   Nasal Congestion    Entered by patient    HPI Rachel Wood is a 72 y.o. female.   The history is provided by the patient.   The patient presents with a 3-day history of nasal congestion, headache, cough, and bodyaches.  Patient denies fever, ear pain, ear drainage, wheezing, shortness of breath, difficulty breathing, chest pain, abdominal pain, nausea, vomiting, or diarrhea.  Patient states "it feels like it is getting harder for me to breathe".  Patient reports she has been taking Mucinex and NyQuil for her symptoms with some relief.  Patient states that the cough has also been productive of yellow sputum.  She states that she has been around a friend who has been sick with the same or similar symptoms.  Patient reports history of seasonal allergies.  Past Medical History:  Diagnosis Date   Arthritis    Cancer (HCC) 2004   Breast Cancer   Constipation    Depression    GERD (gastroesophageal reflux disease)    History of kidney stones    Hypercholesteremia    Hypertension    Osteoporosis    Personal history of chemotherapy 2004   Personal history of radiation therapy 2004   PONV (postoperative nausea and vomiting)    Stroke [facility])    While on Tamoxifen    Patient Active Problem List   Diagnosis Date Noted   Chronic kidney disease, stage 3a (HCC) 11/02/2021   Memory changes 11/02/2021   Gastroesophageal reflux disease without esophagitis 05/01/2018   Hypertension 03/07/2016   History of melanoma 12/20/2014   Hyperlipidemia 12/20/2014    Past Surgical History:  Procedure Laterality Date   CHOLECYSTECTOMY     COLONOSCOPY  12/24/2011   Procedure: COLONOSCOPY;  Surgeon: West Bali, MD;  [facility];  Service: Endoscopy;  Laterality: N/A;  8:30 AM   Left breast lumpectomy  2004   LYMPH GLAND EXCISION      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 Authorizing Provider  cetirizine (ZYRTEC) 10 MG tablet Take 1 tablet (10 mg total) by mouth daily. 01/19/23  Yes Erienne Spelman-Warren, Sadie Haber, NP  doxycycline (VIBRA-TABS) 100 MG tablet Take 1 tablet (100 mg total) by mouth 2 (two) times daily for 7 days. 01/23/23 01/30/23 Yes Kalief Kattner-Warren, Sadie Haber, NP  fluticasone (FLONASE) 50 MCG/ACT nasal spray Place 2 sprays into both nostrils daily. 01/19/23  Yes Naela Nodal-Warren, Sadie Haber, NP  amLODipine (NORVASC) 5 MG tablet Take 5 mg by mouth daily.    [provider]  diazepam (VALIUM) 2 MG  tablet One tablet every six hours as needed for spasm.  May cause drowsiness. 06/19/22   Darreld Mclean, MD  fenofibrate 160 MG tablet Take 160 mg by mouth daily. 12/13/18   [provider]  omeprazole (PRILOSEC) 20 MG capsule Take 20 mg by mouth daily.    [provider]  PARoxetine (PAXIL) 10 MG tablet Take 10 mg by mouth every morning.     [provider]  rosuvastatin (CRESTOR) 5 MG tablet Take 5 mg by mouth every Monday, Wednesday, and Friday.    [provider]  traMADol (ULTRAM) 50 MG tablet One tablet every six hours as needed for pain. 07/04/22   Darreld Mclean, MD  valsartan-hydrochlorothiazide (DIOVAN-HCT) 320-12.5 MG tablet Take 1 tablet by mouth daily.    [provider]  vitamin B-12 (CYANOCOBALAMIN) 1000 MCG tablet Take 1,000 mcg by mouth daily.    [provider]    Family History Family History  Problem  Relation Age of Onset   Heart disease Mother    Hypertension Mother    Osteoporosis Mother     Social History Social History   Tobacco Use   Smoking status: Never   Smokeless tobacco: Never  Vaping Use   Vaping Use: Never used  Substance Use Topics   Alcohol use: No   Drug use: No     Allergies   Codeine, Amoxicillin, and Oxycodone   Review of Systems Review of Systems Per HPI  Physical Exam Triage Vital Signs ED Triage Vitals  Enc Vitals Group     BP 01/19/23 1342 (!) 140/68     Pulse Rate 01/19/23 1342 88     Resp 01/19/23 1342 18     Temp 01/19/23 1342 98.2 F (36.8 C)     Temp Source 01/19/23 1342 Oral     SpO2 01/19/23 1342 95 %     Weight --      Height --      Head Circumference --      Peak Flow --      Pain Score 01/19/23 1344 9     Pain Loc --      Pain Edu? --      Excl. in GC? --    No data found.  Updated Vital Signs BP (!) 140/68 (BP Location: Right Arm)   Pulse 88   Temp 98.2 F (36.8 C) (Oral)   Resp 18   SpO2 95%   Visual Acuity Right Eye Distance:   Left Eye Distance:   Bilateral Distance:    Right Eye Near:   Left Eye Near:    Bilateral Near:     Physical Exam Vitals and nursing note reviewed.  Constitutional:      General: She is not in acute distress.    Appearance: Normal appearance.  HENT:     Head: Normocephalic.     Right Ear: Tympanic membrane, ear canal and external ear normal.     Left Ear: Tympanic membrane, ear canal and external ear normal.     Nose: Congestion present.     Right Turbinates: Enlarged and swollen.     Left Turbinates: Enlarged and swollen.     Right Sinus: Maxillary sinus tenderness present.     Left Sinus: Maxillary sinus tenderness present.     Mouth/Throat:     Lips: Pink.     Mouth: Mucous membranes are moist.     Pharynx: Oropharynx is clear. Uvula midline. Posterior oropharyngeal erythema present. No pharyngeal swelling, oropharyngeal  exudate or uvula swelling.     Comments:  Cobblestoning present to posterior oropharynx Eyes:     Extraocular Movements: Extraocular movements intact.     Conjunctiva/sclera: Conjunctivae normal.     Pupils: Pupils are equal, round, and reactive to light.  Cardiovascular:     Rate and Rhythm: Normal rate and regular rhythm.     Pulses: Normal pulses.     Heart sounds: Normal heart sounds.  Pulmonary:     Effort: Pulmonary effort is normal. No respiratory distress.     Breath sounds: Normal breath sounds. No stridor. No wheezing, rhonchi or rales.  Abdominal:     General: Bowel sounds are normal.     Palpations: Abdomen is soft.     Tenderness: There is no abdominal tenderness.  Musculoskeletal:     Cervical back: Normal range of motion.  Lymphadenopathy:     Cervical: No cervical adenopathy.  Skin:    General: Skin is warm and dry.  Neurological:     General: No focal deficit present.     Mental Status: She is alert and oriented to person, place, and time.  Psychiatric:        Mood and Affect: Mood normal.        Behavior: Behavior normal.      UC Treatments / Results  Labs (all labs ordered are listed, but only abnormal results are displayed) Labs Reviewed - No data to display  EKG   Radiology No results found.  Procedures Procedures (including critical care time)  Medications Ordered in UC Medications - No data to display  Initial Impression / Assessment and Plan / UC Course  I have reviewed the triage vital signs and the nursing notes.  Pertinent labs & imaging results that were available during my care of the patient were reviewed by me and considered in my medical decision making (see chart for details).  The patient is well-appearing, she is in no acute distress, vital signs are stable.  Differential diagnoses include allergic rhinitis versus viral upper respiratory infection.  Patient declined viral testing.  Will treat patient with fluticasone 50 mcg nasal spray and cetirizine 10 mg for the  next several days.  In the event patient symptoms are not improving, a prescription for Doxycycline 100 mg has been sent to her preferred pharmacy for pickup on 01/23/2023.  Supportive care recommendations were provided and discussed with the patient to include increasing fluids, allowing for plenty of rest, use of normal saline nasal spray throughout the day to help with nasal congestion, and sleeping elevated on pillows the use of a humidifier at bedtime.  Patient was in agreement with this plan of care and verbalized understanding.  All questions were answered.  Patient is stable for discharge.  Final Clinical Impressions(s) / UC Diagnoses   Final diagnoses:  Acute upper respiratory infection     Discharge Instructions      Take medication as prescribed. Continue Mucinex for the cough. Increase fluids and allow for plenty of rest. Recommend Tylenol as needed for pain, fever, or general discomfort. May use normal saline nasal spray throughout the day to help with nasal congestion and runny nose. As discussed, if your symptoms do not improve by 01/23/23, pick up your prescription for the antibiotic at your preferred pharmacy. Recommend using a humidifier at bedtime during sleep to help with cough and nasal congestion. Sleep elevated on 2 pillows. If symptoms do not improve with this treatment, please follow-up with your primary care physician for  further evaluation. Follow-up as needed.     ED Prescriptions     Medication Sig Dispense Auth. Provider   fluticasone (FLONASE) 50 MCG/ACT nasal spray Place 2 sprays into both nostrils daily. 16 g Jahsiah Carpenter-Warren, Sadie Haber, NP   cetirizine (ZYRTEC) 10 MG tablet Take 1 tablet (10 mg total) by mouth daily. 30 tablet Benigna Delisi-Warren, Sadie Haber, NP   doxycycline (VIBRA-TABS) 100 MG tablet Take 1 tablet (100 mg total) by mouth 2 (two) times daily for 7 days. 14 tablet Alyse Kathan-Warren, Sadie Haber, NP      PDMP not reviewed this encounter.    Abran Cantor, NP 01/19/23 1402

## 2023-01-19 NOTE — ED Triage Notes (Signed)
Nasal congestion and cough  and body aches since Wednesday.  Has been taking mucinex with some relief.  Cough productive at times.

## 2023-01-19 NOTE — Discharge Instructions (Signed)
Take medication as prescribed. Continue Mucinex for the cough. Increase fluids and allow for plenty of rest. Recommend Tylenol as needed for pain, fever, or general discomfort. May use normal saline nasal spray throughout the day to help with nasal congestion and runny nose. As discussed, if your symptoms do not improve by 01/23/23, pick up your prescription for the antibiotic at your preferred pharmacy. Recommend using a humidifier at bedtime during sleep to help with cough and nasal congestion. Sleep elevated on 2 pillows. If symptoms do not improve with this treatment, please follow-up with your primary care physician for further evaluation. Follow-up as needed.

## 2023-02-07 ENCOUNTER — Encounter: Payer: Self-pay | Admitting: *Deleted

## 2023-08-15 ENCOUNTER — Encounter (INDEPENDENT_AMBULATORY_CARE_PROVIDER_SITE_OTHER): Payer: Self-pay | Admitting: *Deleted

## 2023-09-11 ENCOUNTER — Other Ambulatory Visit (HOSPITAL_COMMUNITY): Payer: Self-pay | Admitting: Physician Assistant

## 2023-09-11 DIAGNOSIS — Z1231 Encounter for screening mammogram for malignant neoplasm of breast: Secondary | ICD-10-CM

## 2023-09-25 ENCOUNTER — Ambulatory Visit (HOSPITAL_COMMUNITY)
Admission: RE | Admit: 2023-09-25 | Discharge: 2023-09-25 | Disposition: A | Discharge status: Home / Self Care | Payer: Medicare Other | Source: Ambulatory Visit | Attending: Physician Assistant | Admitting: Physician Assistant

## 2023-09-25 DIAGNOSIS — Z1231 Encounter for screening mammogram for malignant neoplasm of breast: Secondary | ICD-10-CM | POA: Insufficient documentation

## 2023-11-04 ENCOUNTER — Encounter: Payer: Self-pay | Admitting: Internal Medicine

## 2023-11-20 ENCOUNTER — Encounter (HOSPITAL_COMMUNITY): Payer: Self-pay

## 2023-11-27 ENCOUNTER — Encounter: Payer: Self-pay | Admitting: Internal Medicine

## 2023-11-27 ENCOUNTER — Ambulatory Visit (AMBULATORY_SURGERY_CENTER)

## 2023-11-27 VITALS — Ht 63.0 in | Wt 157.0 lb

## 2023-11-27 DIAGNOSIS — Z1211 Encounter for screening for malignant neoplasm of colon: Secondary | ICD-10-CM

## 2023-11-27 MED ORDER — NA SULFATE-K SULFATE-MG SULF 17.5-3.13-1.6 GM/177ML PO SOLN
1.0000 | Freq: Once | ORAL | 0 refills | Status: AC
Start: 2023-11-27 — End: 2023-11-27

## 2023-11-27 NOTE — Progress Notes (Signed)
 No egg or soy allergy known to patient  PONV past sedation with any surgeries or procedures Patient denies ever being told they had issues or difficulty with intubation  No FH of Malignant Hyperthermia Pt is not on diet pills Pt is not on  home 02  Pt is not on blood thinners  Pt denies issues with constipation  No A fib or A flutter Have any cardiac testing pending--no LOA: independent  Prep: suprep   Patient's chart reviewed by Rogena Class CNRA prior to previsit and patient appropriate for the [facility].  Previsit completed and red dot placed by patient's name on their procedure day (on provider's schedule).     PV completed with patient. Prep instructions sent via mychart and home address.

## 2023-12-10 NOTE — Progress Notes (Unsigned)
  Gastroenterology History and Physical   Primary Care Physician:  Davis Esters   Reason for Procedure:  Colon cancer screening  Plan:    Colonoscopy     HPI: Rachel Wood is a 73 y.o. female status post a colonoscopy for screening in 2013, demonstrating internal hemorrhoids.  Presents today for a repeat screening colonoscopy.   Past Medical History:  Diagnosis Date   Arthritis    Cancer (HCC) 2004   Breast Cancer   Constipation    Depression    GERD (gastroesophageal reflux disease)    History of kidney stones    Hypercholesteremia    Hypertension    Osteoporosis    Personal history of chemotherapy 2004   Personal history of radiation therapy 2004   PONV (postoperative nausea and vomiting)    Stroke [facility])    While on Tamoxifen    Past Surgical History:  Procedure Laterality Date   CHOLECYSTECTOMY     COLONOSCOPY  12/24/2011   Procedure: COLONOSCOPY;  Surgeon: Alyce Jubilee, MD;  [facility];  Service: Endoscopy;  Laterality: N/A;  8:30 AM   Left breast lumpectomy  2004   LYMPH GLAND EXCISION     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   Procedure: TOTAL KNEE ARTHROPLASTY;  Surgeon: Murleen Arms, MD;  Location: [facility];  Service: Orthopedics;  Laterality: Left;   TRACHEOSTOMY     as a child    Prior to Admission medications   Medication Sig Start Date End Date Taking? Authorizing Provider  amLODipine (NORVASC) 5 MG tablet Take 5 mg by mouth daily.    [provider]  cetirizine  (ZYRTEC ) 10 MG tablet Take 1 tablet (10 mg total) by mouth daily. Patient not taking: Reported on 11/27/2023 01/19/23   Leath-Warren, Belen Bowers, NP  diazepam  (VALIUM ) 2 MG tablet One tablet every six hours as needed for spasm.  May cause drowsiness. Patient not taking: Reported on 11/27/2023 06/19/22   Pleasant Brilliant, MD  ezetimibe (ZETIA) 10 MG tablet Take 10 mg by mouth daily. 10/24/23   [provider]  fenofibrate 160 MG tablet Take  160 mg by mouth daily. 12/13/18   [provider]  fluticasone  (FLONASE ) 50 MCG/ACT nasal spray Place 2 sprays into both nostrils daily. Patient not taking: Reported on 11/27/2023 01/19/23   Leath-Warren, Belen Bowers, NP  omeprazole (PRILOSEC) 20 MG capsule Take 20 mg by mouth daily.    [provider]  PARoxetine (PAXIL) 10 MG tablet Take 10 mg by mouth every morning.     [provider]  rosuvastatin (CRESTOR) 5 MG tablet Take 5 mg by mouth every Monday, Wednesday, and Friday.    [provider]  traMADol  (ULTRAM ) 50 MG tablet One tablet every six hours as needed for pain. 07/04/22   Pleasant Brilliant, MD  TURMERIC PO Take 1 capsule by mouth daily.    [provider]  valsartan-hydrochlorothiazide (DIOVAN-HCT) 320-12.5 MG tablet Take 1 tablet by mouth daily.    [provider]  vitamin B-12 (CYANOCOBALAMIN) 1000 MCG tablet Take 1,000 mcg by mouth daily.    [provider]    Current Outpatient Medications  Medication Sig Dispense Refill   amLODipine (NORVASC) 5 MG tablet Take 5 mg by mouth daily.     omeprazole (PRILOSEC) 20 MG capsule Take 20 mg by mouth daily.     PARoxetine (PAXIL) 10 MG tablet Take 10 mg by mouth every morning.      rosuvastatin (CRESTOR)  5 MG tablet Take 5 mg by mouth every Monday, Wednesday, and Friday.     valsartan-hydrochlorothiazide (DIOVAN-HCT) 320-12.5 MG tablet Take 1 tablet by mouth daily.     cetirizine  (ZYRTEC ) 10 MG tablet Take 1 tablet (10 mg total) by mouth daily. (Patient not taking: Reported on 12/11/2023) 30 tablet 0   diazepam  (VALIUM ) 2 MG tablet One tablet every six hours as needed for spasm.  May cause drowsiness. (Patient not taking: Reported on 12/11/2023) 30 tablet 0   ezetimibe (ZETIA) 10 MG tablet Take 10 mg by mouth daily. (Patient not taking: Reported on 12/11/2023)     fenofibrate 160 MG tablet Take 160 mg by mouth daily.     fluticasone  (FLONASE ) 50 MCG/ACT nasal spray Place 2 sprays into  both nostrils daily. (Patient not taking: Reported on 11/27/2023) 16 g 0   traMADol  (ULTRAM ) 50 MG tablet One tablet every six hours as needed for pain. (Patient not taking: Reported on 12/11/2023) 20 tablet 3   TURMERIC PO Take 1 capsule by mouth daily.     vitamin B-12 (CYANOCOBALAMIN) 1000 MCG tablet Take 1,000 mcg by mouth daily.     Current Facility-Administered Medications  Medication Dose Route Frequency Provider Last Rate Last Admin   0.9 %  sodium chloride  infusion  500 mL Intravenous Once Kenney Peacemaker, MD        Allergies as of 12/11/2023 - Review Complete 12/11/2023  Allergen Reaction Noted   Codeine Nausea And Vomiting 11/21/2011   Amoxicillin Other (See Comments) 04/11/2022   Oxycodone  Nausea And Vomiting 12/12/2021    Family History  Problem Relation Age of Onset   Heart disease Mother    Hypertension Mother    Osteoporosis Mother    Clotting disorder Maternal Aunt    Rectal cancer Neg Hx    Stomach cancer Neg Hx     Social History   Socioeconomic History   Marital status: Married    Spouse name: Not on file   Number of children: Not on file   Years of education: Not on file   Highest education level: Not on file  Occupational History   Not on file  Tobacco Use   Smoking status: Never   Smokeless tobacco: Never  Vaping Use   Vaping status: Never Used  Substance and Sexual Activity   Alcohol use: No   Drug use: No   Sexual activity: Yes    Birth control/protection: None  Other Topics Concern   Not on file  Social History Narrative   Not on file   Social Drivers of Health   Financial Resource Strain: Low Risk  (10/24/2023)   Received from [facility]   Overall Financial Resource Strain (CARDIA)    Difficulty of Paying Living Expenses: Not hard at all  Food Insecurity: No Food Insecurity (10/24/2023)   Received from [facility]   Hunger Vital Sign    Worried About Running Out of Food in the Last Year: Never true    Ran Out of Food in the  Last Year: Never true  Transportation Needs: No Transportation Needs (10/24/2023)   Received from [facility] - Transportation    Lack of Transportation (Medical): No    Lack of Transportation (Non-Medical): No  Physical Activity: Unknown (01/23/2023)   Received from [facility]   Exercise Vital Sign    Days of Exercise per Week: 0 days    Minutes of Exercise per Session: Not on file  Stress: No Stress Concern  Present (01/23/2023)   Received from [facility] - Occupational Stress Questionnaire    Feeling of Stress : Not at all  Social Connections: Socially Integrated (01/23/2023)   Received from [facility]   Social Network    How would you rate your social network (family, work, friends)?: Good participation with social networks  Intimate Partner Violence: Not At Risk (01/23/2023)   Received from [facility]   HITS    Over the last 12 months how often did your partner physically hurt you?: Never    Over the last 12 months how often did your partner insult you or talk down to you?: Never    Over the last 12 months how often did your partner threaten you with physical harm?: Never    Over the last 12 months how often did your partner scream or curse at you?: Never    Review of Systems:  All other review of systems negative except as mentioned in the HPI.  Physical Exam: Vital signs BP 139/80   Pulse 63   Temp (!) 97.3 F (36.3 C)   Ht 5\' 3"  (1.6 m)   Wt 157 lb (71.2 kg)   SpO2 97%   BMI 27.81 kg/m   General:   Alert,  Well-developed, well-nourished, pleasant and cooperative in NAD Lungs:  Clear throughout to auscultation.   Heart:  Regular rate and rhythm; no murmurs, clicks, rubs,  or gallops. Abdomen:  Soft, nontender and nondistended. Normal bowel sounds.   Neuro/Psych:  Alert and cooperative. Normal mood and affect. A and O x 3   @Mykale Gandolfo  Tammie Fall, MD, [facility] [phone]  (pager) 12/11/2023 10:00 AM@

## 2023-12-11 ENCOUNTER — Encounter: Payer: Self-pay | Admitting: Internal Medicine

## 2023-12-11 ENCOUNTER — Ambulatory Visit: Admitting: Internal Medicine

## 2023-12-11 VITALS — BP 141/76 | HR 54 | Temp 97.3°F | Resp 9 | Ht 63.0 in | Wt 157.0 lb

## 2023-12-11 DIAGNOSIS — K648 Other hemorrhoids: Secondary | ICD-10-CM

## 2023-12-11 DIAGNOSIS — Z1211 Encounter for screening for malignant neoplasm of colon: Secondary | ICD-10-CM | POA: Diagnosis not present

## 2023-12-11 DIAGNOSIS — D120 Benign neoplasm of cecum: Secondary | ICD-10-CM

## 2023-12-11 DIAGNOSIS — K635 Polyp of colon: Secondary | ICD-10-CM | POA: Diagnosis not present

## 2023-12-11 MED ORDER — SODIUM CHLORIDE 0.9 % IV SOLN
500.0000 mL | Freq: Once | INTRAVENOUS | Status: DC
Start: 2023-12-11 — End: 2023-12-11

## 2023-12-11 NOTE — Progress Notes (Signed)
 Pt's states no medical or surgical changes since previsit or office visit.

## 2023-12-11 NOTE — Progress Notes (Signed)
 Report given to PACU, vss

## 2023-12-11 NOTE — Op Note (Signed)
 [facility] Patient Name: Rachel Wood Procedure Date: 12/11/2023 9:53 AM MRN: [id number] Endoscopist: Kenney Peacemaker , MD, [id number] Age: 73 Referring MD:  Date of Birth: [birth year] Gender: Female Account #: [id number] Procedure:                Colonoscopy Indications:              Screening for colorectal malignant neoplasm, Last                            colonoscopy: 2013 Medicines:                Monitored Anesthesia Care Procedure:                Pre-Anesthesia Assessment:                           - Prior to the procedure, a History and Physical                            was performed, and patient medications and                            allergies were reviewed. The patient's tolerance of                            previous anesthesia was also reviewed. The risks                            and benefits of the procedure and the sedation                            options and risks were discussed with the patient.                            All questions were answered, and informed consent                            was obtained. Prior Anticoagulants: The patient has                            taken no anticoagulant or antiplatelet agents. ASA                            Grade Assessment: III - A patient with severe                            systemic disease. After reviewing the risks and                            benefits, the patient was deemed in satisfactory                            condition to undergo the procedure.  After obtaining informed consent, the colonoscope                            was passed under direct vision. Throughout the                            procedure, the patient's blood pressure, pulse, and                            oxygen saturations were monitored continuously. The                            CF [id number] #[id number] was introduced through the anus                            and advanced to the the cecum,  identified by                            appendiceal orifice and ileocecal valve. The                            colonoscopy was performed without difficulty. The                            patient tolerated the procedure well. The quality                            of the bowel preparation was good. The bowel                            preparation used was SUPREP via split dose                            instruction. The ileocecal valve, appendiceal                            orifice, and rectum were photographed. Scope In: 10:11:40 AM Scope Out: 10:26:22 AM Scope Withdrawal Time: 0 hours 11 minutes 34 seconds  Total Procedure Duration: 0 hours 14 minutes 42 seconds  Findings:                 The perianal and digital rectal examinations were                            normal.                           A 3 mm polyp was found in the cecum. The polyp was                            sessile. The polyp was removed with a cold snare.                            Resection and retrieval were complete. Verification  of patient identification for the specimen was                            done. Estimated blood loss was minimal.                           Internal hemorrhoids were found.                           The exam was otherwise without abnormality on                            direct and retroflexion views. Complications:            No immediate complications. Estimated Blood Loss:     Estimated blood loss was minimal. Impression:               - One 3 mm polyp in the cecum, removed with a cold                            snare. Resected and retrieved.                           - Internal hemorrhoids.                           - The examination was otherwise normal on direct                            and retroflexion views. Recommendation:           - Patient has a contact number available for                            emergencies. The signs and symptoms of  potential                            delayed complications were discussed with the                            patient. Return to normal activities tomorrow.                            Written discharge instructions were provided to the                            patient.                           - Resume previous diet.                           - Continue present medications.                           - Await pathology results.                           -  No recommendation at this time regarding repeat                            colonoscopy due to age. Kenney Peacemaker, MD 12/11/2023 10:34:32 AM This report has been signed electronically.

## 2023-12-11 NOTE — Patient Instructions (Addendum)
 I found and removed one tiny polyp that looks benign.  Also saw some hemorrhoids.  All else ok.  I will let you know pathology results and when/if to have another routine colonoscopy by mail and/or My Chart.  I appreciate the opportunity to care for you. Kenney Peacemaker, MD, [facility]  *Handouts given on polyps and hemorrhoids**  YOU HAD AN ENDOSCOPIC PROCEDURE TODAY AT THE [facility]:   Refer to the procedure report that was given to you for any specific questions about what was found during the examination.  If the procedure report does not answer your questions, please call your gastroenterologist to clarify.  If you requested that your care partner not be given the details of your procedure findings, then the procedure report has been included in a sealed envelope for you to review at your convenience later.  YOU SHOULD EXPECT: Some feelings of bloating in the abdomen. Passage of more gas than usual.  Walking can help get rid of the air that was put into your GI tract during the procedure and reduce the bloating. If you had a lower endoscopy (such as a colonoscopy or flexible sigmoidoscopy) you may notice spotting of blood in your stool or on the toilet paper. If you underwent a bowel prep for your procedure, you may not have a normal bowel movement for a few days.  Please Note:  You might notice some irritation and congestion in your nose or some drainage.  This is from the oxygen used during your procedure.  There is no need for concern and it should clear up in a day or so.  SYMPTOMS TO REPORT IMMEDIATELY:  Following lower endoscopy (colonoscopy or flexible sigmoidoscopy):  Excessive amounts of blood in the stool  Significant tenderness or worsening of abdominal pains  Swelling of the abdomen that is new, acute  Fever of 100F or higher  For urgent or emergent issues, a gastroenterologist can be reached at any hour by calling [phone]. Do not use MyChart messaging  for urgent concerns.    DIET:  We do recommend a small meal at first, but then you may proceed to your regular diet.  Drink plenty of fluids but you should avoid alcoholic beverages for 24 hours.  ACTIVITY:  You should plan to take it easy for the rest of today and you should NOT DRIVE or use heavy machinery until tomorrow (because of the sedation medicines used during the test).    FOLLOW UP: Our staff will call the number listed on your records the next business day following your procedure.  We will call around 7:15- 8:00 am to check on you and address any questions or concerns that you may have regarding the information given to you following your procedure. If we do not reach you, we will leave a message.     If any biopsies were taken you will be contacted by phone or by letter within the next 1-3 weeks.  Please call us  at [phone] if you have not heard about the biopsies in 3 weeks.    SIGNATURES/CONFIDENTIALITY: You and/or your care partner have signed paperwork which will be entered into your electronic medical record.  These signatures attest to the fact that that the information above on your After Visit Summary has been reviewed and is understood.  Full responsibility of the confidentiality of this discharge information lies with you and/or your care-partner.

## 2023-12-11 NOTE — Progress Notes (Signed)
 Called to room to assist during endoscopic procedure.  Patient ID and intended procedure confirmed with present staff. Received instructions for my participation in the procedure from the performing physician.

## 2023-12-12 ENCOUNTER — Telehealth: Payer: Self-pay

## 2023-12-12 NOTE — Telephone Encounter (Signed)
 No answer after follow up call. Voice message left.

## 2023-12-17 LAB — SURGICAL PATHOLOGY

## 2023-12-20 ENCOUNTER — Ambulatory Visit: Payer: Self-pay | Admitting: Internal Medicine

## 2024-03-06 ENCOUNTER — Encounter: Payer: Self-pay | Admitting: Radiology

## 2024-05-18 ENCOUNTER — Encounter: Payer: Self-pay | Admitting: Radiology
